# Patient Record
Sex: Female | Born: 1940 | Race: White | Hispanic: No
Health system: Midwestern US, Community
[De-identification: ages and names within clinical notes are randomized; demographics above are authoritative.]

## PROBLEM LIST (undated history)

## (undated) DIAGNOSIS — I10 Essential (primary) hypertension: Secondary | ICD-10-CM

## (undated) DIAGNOSIS — K279 Peptic ulcer, site unspecified, unspecified as acute or chronic, without hemorrhage or perforation: Secondary | ICD-10-CM

## (undated) DIAGNOSIS — K449 Diaphragmatic hernia without obstruction or gangrene: Secondary | ICD-10-CM

## (undated) DIAGNOSIS — K922 Gastrointestinal hemorrhage, unspecified: Secondary | ICD-10-CM

## (undated) DIAGNOSIS — M810 Age-related osteoporosis without current pathological fracture: Secondary | ICD-10-CM

## (undated) DIAGNOSIS — K2270 Barrett's esophagus without dysplasia: Secondary | ICD-10-CM

## (undated) DIAGNOSIS — R7303 Prediabetes: Secondary | ICD-10-CM

## (undated) DIAGNOSIS — M1990 Unspecified osteoarthritis, unspecified site: Secondary | ICD-10-CM

## (undated) HISTORY — PX: CHOLECYSTECTOMY: SHX55

## (undated) HISTORY — PX: OTHER SURGICAL HISTORY: SHX169

## (undated) HISTORY — PX: CATARACT EXTRACTION W/ INTRAOCULAR LENS IMPLANT: SHX1309

## (undated) HISTORY — PX: TONSILLECTOMY: SUR1361

---

## 2000-11-24 ENCOUNTER — Encounter: Payer: Self-pay | Admitting: Orthopedic Surgery

## 2000-11-24 ENCOUNTER — Encounter: Admission: RE | Admit: 2000-11-24 | Discharge: 2000-11-24 | Payer: Self-pay | Admitting: Orthopedic Surgery

## 2005-02-22 ENCOUNTER — Other Ambulatory Visit: Admission: RE | Admit: 2005-02-22 | Discharge: 2005-02-22 | Payer: Self-pay | Admitting: Family Medicine

## 2005-05-18 ENCOUNTER — Ambulatory Visit (HOSPITAL_COMMUNITY): Admission: RE | Admit: 2005-05-18 | Discharge: 2005-05-18 | Payer: Self-pay | Admitting: Gastroenterology

## 2008-04-25 ENCOUNTER — Ambulatory Visit (HOSPITAL_COMMUNITY): Admission: RE | Admit: 2008-04-25 | Discharge: 2008-04-25 | Payer: Self-pay | Admitting: Family Medicine

## 2008-07-25 ENCOUNTER — Encounter (INDEPENDENT_AMBULATORY_CARE_PROVIDER_SITE_OTHER): Payer: Self-pay | Admitting: Surgery

## 2008-07-25 ENCOUNTER — Ambulatory Visit (HOSPITAL_COMMUNITY): Admission: RE | Admit: 2008-07-25 | Discharge: 2008-07-25 | Payer: Self-pay | Admitting: Surgery

## 2010-05-04 ENCOUNTER — Ambulatory Visit (HOSPITAL_BASED_OUTPATIENT_CLINIC_OR_DEPARTMENT_OTHER): Admission: RE | Admit: 2010-05-04 | Discharge: 2010-05-04 | Payer: Self-pay | Admitting: Orthopaedic Surgery

## 2011-02-28 LAB — BASIC METABOLIC PANEL
BUN: 13 mg/dL (ref 6–23)
CO2: 30 mEq/L (ref 19–32)
Calcium: 8.8 mg/dL (ref 8.4–10.5)
Chloride: 103 mEq/L (ref 96–112)
Creatinine, Ser: 0.6 mg/dL (ref 0.4–1.2)
GFR calc Af Amer: >60 mL/min (ref >60)
GFR calc non Af Amer: >60 mL/min (ref >60)
Glucose, Bld: 114 mg/dL — ABNORMAL HIGH (ref 70–99)
Potassium: 3.9 mEq/L (ref 3.5–5.1)
Sodium: 140 mEq/L (ref 135–145)

## 2011-02-28 LAB — POCT HEMOGLOBIN-HEMACUE: Hemoglobin: 12 g/dL (ref 12.0–15.0)

## 2011-04-26 NOTE — Op Note (Signed)
Marie Carpenter, Marie Carpenter               ACCOUNT NO.:  [id number]   MEDICAL RECORD NO.:  [id number]          PATIENT TYPE:  AMB   LOCATION:  DAY                          FACILITY:  [facility]   PHYSICIAN:  Wilmon Arms. Corliss Skains, M.D. DATE OF BIRTH:  October 30, 1941   DATE OF PROCEDURE:  07/25/2008  DATE OF DISCHARGE:                               OPERATIVE REPORT   PREOPERATIVE DIAGNOSIS:  Chronic calculus cholecystitis.   POSTOPERATIVE DIAGNOSIS:  1. Chronic calculus cholecystitis.  2. Umbilical hernia.   PROCEDURE PERFORMED:  1. Laparoscopic cholecystectomy with intraoperative cholangiogram.  2. Umbilical hernia repair.   SURGEON:  Wilmon Arms. Corliss Skains, MD   ASSISTANT:  Velora Heckler, MD   ANESTHESIA:  General endotracheal.   INDICATIONS:  The patient is a 69 year old female who presented with a  couple months of intermittent epigastric pain and bloating.  She  initially started having some pain after eating some fast food.  A CT  scan showed several large gallstones but no sign of gallbladder wall  thickening.  Her liver function tests were normal.  She presents now for  elective cholecystectomy.   DESCRIPTION OF PROCEDURE:  The patient was brought to the operating room  and placed in a supine position on the operating table.  After an  adequate level of general anesthesia was obtained the patient's abdomen  was prepped with Betadine and draped in a sterile fashion.  A time-out  was taken to assure the proper patient and proper procedure.  The area  below her umbilicus was infiltrated with 0.25% Marcaine with  epinephrine.  We made a transverse incision here.  Dissection was  carried down to the fascia and the fascia was opened vertically between  Kocher clamps.  The peritoneal cavity was bluntly entered.  A stay  suture of 0 Vicryl was placed in the fascial opening.  The Hasson  cannula was inserted and secured to the stay suture.  Pneumoperitoneum  was obtained by insufflating CO2 and  maintaining a maximal pressure of  15 mmHg.  The laparoscope was inserted and the patient was positioned  reverse Trendelenburg and tilted to her left.  An 11 mm port was placed  in subxiphoid position.  Two 5 mm ports were placed in the right upper  quadrant.  A very large thick distended gallbladder was identified.  We  had difficulty grasping this with a clamp.  However, when I tried to  insert the aspiration needle into the gallbladder the wall was too thick  to allow passage of the needle.  We were finally able to get some  traction and retract the gallbladder superiorly.  There were a lot of  omental adhesions to the surface of the gallbladder.  These were taken  down with cautery and blunt dissection.  We were finally able to  identify the neck of the gallbladder.  We bluntly dissected around the  cystic duct.  The cystic duct was ligated with a clip distally.  A small  opening was created on the cystic duct.  A Cook cholangiogram catheter  was then inserted through a stab incision and  threaded into the cystic  duct.  It was secured with a clip.  A cholangiogram was obtained which  showed good flow proximally and distally in biliary tree with no sign of  filling defects or obstruction.  Contrast flowed easily in duodenum.  The catheter was then removed and the cystic duct was ligated with clips  and divided.  Two branch of the cystic artery were then ligated with  clips and divided.  Cautery was then used to remove the gallbladder from  the liver.  Hemostasis was good.  The gallbladder was placed in an  EndoCatch sac.  We thoroughly irrigated the right upper quadrant and  suctioned it dry.  No bleeding or bile leak was noted.  We then pulled  the EndoCatch sac and gallbladder up to the umbilical port site.  We had  to enlarge the fascia slightly to allow removal of this large  gallbladder with a couple of very large gallstones.  The fascia was then  reapproximated with a pursestring  suture.  We then noted an umbilical  hernia just above our fascial opening.  We enlarged our incision  slightly.  I dissected the umbilical hernia free from the undersurface  of the umbilicus.  This was dissected free circumferentially and was  reduced up through the hernia defect which measured only about a  centimeter across.  The hernia defect was then reapproximated with three  interrupted zero PDS sutures.  The subcutaneous tissues were irrigated.  Monocryl 4-0 was used to close all skin incisions after removing the  ports and releasing pneumoperitoneum.  Steri-Strips and Kling dressings  were applied.  The patient was then extubated and brought to recovery in  stable condition.  All sponge, instrument and needle counts were  correct.      Wilmon Arms. Tsuei, M.D.  Electronically Signed     MKT/MEDQ  D:  07/25/2008  T:  07/25/2008  Job:  [id number]

## 2011-04-29 NOTE — Op Note (Signed)
Marie Carpenter, Marie Carpenter               ACCOUNT NO.:  [id number]   MEDICAL RECORD NO.:  [id number]          PATIENT TYPE:  AMB   LOCATION:  ENDO                         FACILITY:  [facility]   PHYSICIAN:  John C. Madilyn Fireman, M.D.    DATE OF BIRTH:  November 29, 1941   DATE OF PROCEDURE:  05/18/2005  DATE OF DISCHARGE:                                 OPERATIVE REPORT   PROCEDURE:  Colonoscopy.   INDICATIONS FOR PROCEDURE:  Average risk colon cancer screening.   DESCRIPTION OF PROCEDURE:  The patient was placed in the left lateral  decubitus position and placed on the pulse monitor with continuous low-flow  oxygen delivered by nasal cannula. She was sedated with 50 mcg IV fentanyl  and 5 mg IV Versed. The Olympus video colonoscope was inserted into the  rectum and advanced to the cecum, confirmed by transillumination at  McBurney's and point visualization at the ileocecal valve and appendiceal  orifice. The prep was good. The cecum, ascending, transverse, and descending  colon all appeared normal with no masses, polyps, diverticula or other  mucosal abnormalities. Within the sigmoid colon, there were seen a few  scattered diverticula, no other abnormalities. The rectum appeared normal  and retroflexed view of the anus revealed no obvious internal hemorrhoids.  The scope was then withdrawn and the patient returned to the recovery room  in stable condition. She tolerated the procedure well and there were no  immediate complications.   IMPRESSION:  Diverticulosis otherwise normal study.   PLAN:  Next colonoscopy within 10 years and consider flexible sigmoidoscopy  and/or Hemoccult's in 5 years.      JCH/MEDQ  D:  05/18/2005  T:  05/18/2005  Job:  [id number]   cc:   Stacie Acres. White, M.D.  [address]  [place]  [place] [address]  Fax: [phone]

## 2011-09-07 LAB — BASIC METABOLIC PANEL
BUN: 6
CO2: 33 — ABNORMAL HIGH
Calcium: 9.3
Chloride: 100
Creatinine, Ser: 0.62
GFR calc Af Amer: >60
GFR calc non Af Amer: >60
Glucose, Bld: 140 — ABNORMAL HIGH
Potassium: 3.7
Sodium: 139

## 2011-09-09 LAB — DIFFERENTIAL
Eosinophils Absolute: 0.2
Eosinophils Relative: 3
Lymphs Abs: 2.5

## 2011-09-09 LAB — COMPREHENSIVE METABOLIC PANEL
ALT: 22
AST: 21
Alkaline Phosphatase: 36 — ABNORMAL LOW
CO2: 34 — ABNORMAL HIGH
Calcium: 9.3
Chloride: 102
GFR calc Af Amer: >60
GFR calc non Af Amer: >60
Potassium: 4.2
Sodium: 140

## 2011-09-09 LAB — CBC
MCHC: 33.1
RBC: 4.39
WBC: 7.7

## 2014-09-08 ENCOUNTER — Other Ambulatory Visit: Payer: Self-pay | Admitting: Family Medicine

## 2016-02-06 ENCOUNTER — Emergency Department (HOSPITAL_COMMUNITY): Payer: Medicare Other

## 2016-02-06 ENCOUNTER — Observation Stay (HOSPITAL_COMMUNITY)
Admission: EM | Admit: 2016-02-06 | Discharge: 2016-02-08 | Disposition: A | Discharge status: Home / Self Care | Payer: Medicare Other | Attending: Internal Medicine | Admitting: Internal Medicine

## 2016-02-06 ENCOUNTER — Encounter (HOSPITAL_COMMUNITY): Payer: Self-pay | Admitting: Emergency Medicine

## 2016-02-06 DIAGNOSIS — K922 Gastrointestinal hemorrhage, unspecified: Secondary | ICD-10-CM

## 2016-02-06 DIAGNOSIS — M25519 Pain in unspecified shoulder: Secondary | ICD-10-CM | POA: Insufficient documentation

## 2016-02-06 DIAGNOSIS — K2270 Barrett's esophagus without dysplasia: Secondary | ICD-10-CM | POA: Diagnosis not present

## 2016-02-06 DIAGNOSIS — Z87891 Personal history of nicotine dependence: Secondary | ICD-10-CM | POA: Diagnosis not present

## 2016-02-06 DIAGNOSIS — D62 Acute posthemorrhagic anemia: Secondary | ICD-10-CM | POA: Insufficient documentation

## 2016-02-06 DIAGNOSIS — Z79899 Other long term (current) drug therapy: Secondary | ICD-10-CM | POA: Insufficient documentation

## 2016-02-06 DIAGNOSIS — I9589 Other hypotension: Secondary | ICD-10-CM

## 2016-02-06 DIAGNOSIS — K921 Melena: Secondary | ICD-10-CM | POA: Diagnosis not present

## 2016-02-06 DIAGNOSIS — D72829 Elevated white blood cell count, unspecified: Secondary | ICD-10-CM | POA: Diagnosis not present

## 2016-02-06 DIAGNOSIS — K449 Diaphragmatic hernia without obstruction or gangrene: Secondary | ICD-10-CM

## 2016-02-06 DIAGNOSIS — Z7983 Long term (current) use of bisphosphonates: Secondary | ICD-10-CM | POA: Insufficient documentation

## 2016-02-06 DIAGNOSIS — I959 Hypotension, unspecified: Secondary | ICD-10-CM | POA: Diagnosis present

## 2016-02-06 DIAGNOSIS — K279 Peptic ulcer, site unspecified, unspecified as acute or chronic, without hemorrhage or perforation: Secondary | ICD-10-CM | POA: Diagnosis present

## 2016-02-06 DIAGNOSIS — K259 Gastric ulcer, unspecified as acute or chronic, without hemorrhage or perforation: Principal | ICD-10-CM | POA: Insufficient documentation

## 2016-02-06 DIAGNOSIS — M810 Age-related osteoporosis without current pathological fracture: Secondary | ICD-10-CM | POA: Diagnosis not present

## 2016-02-06 DIAGNOSIS — R0602 Shortness of breath: Secondary | ICD-10-CM | POA: Diagnosis present

## 2016-02-06 DIAGNOSIS — I10 Essential (primary) hypertension: Secondary | ICD-10-CM | POA: Insufficient documentation

## 2016-02-06 HISTORY — DX: Peptic ulcer, site unspecified, unspecified as acute or chronic, without hemorrhage or perforation: K27.9

## 2016-02-06 HISTORY — DX: Barrett's esophagus without dysplasia: K22.70

## 2016-02-06 HISTORY — DX: Age-related osteoporosis without current pathological fracture: M81.0

## 2016-02-06 HISTORY — DX: Essential (primary) hypertension: I10

## 2016-02-06 HISTORY — DX: Gastrointestinal hemorrhage, unspecified: K92.2

## 2016-02-06 HISTORY — DX: Diaphragmatic hernia without obstruction or gangrene: K44.9

## 2016-02-06 LAB — POC OCCULT BLOOD, ED: Fecal Occult Bld: POSITIVE — AB

## 2016-02-06 LAB — COMPREHENSIVE METABOLIC PANEL
ALT: 17 U/L (ref 14–54)
ANION GAP: 11 (ref 5–15)
AST: 20 U/L (ref 15–41)
Albumin: 3.5 g/dL (ref 3.5–5.0)
Alkaline Phosphatase: 23 U/L — ABNORMAL LOW (ref 38–126)
BILIRUBIN TOTAL: 0.4 mg/dL (ref 0.3–1.2)
BUN: 47 mg/dL — ABNORMAL HIGH (ref 6–20)
CHLORIDE: 102 mmol/L (ref 101–111)
CO2: 25 mmol/L (ref 22–32)
Calcium: 8.8 mg/dL — ABNORMAL LOW (ref 8.9–10.3)
Creatinine, Ser: 0.79 mg/dL (ref 0.44–1.00)
Glucose, Bld: 157 mg/dL — ABNORMAL HIGH (ref 65–99)
POTASSIUM: 4 mmol/L (ref 3.5–5.1)
Sodium: 138 mmol/L (ref 135–145)
TOTAL PROTEIN: 5.8 g/dL — AB (ref 6.5–8.1)

## 2016-02-06 LAB — CBC WITH DIFFERENTIAL/PLATELET
BASOS ABS: 0 10*3/uL (ref 0.0–0.1)
Basophils Relative: 0 %
EOS PCT: 0 %
Eosinophils Absolute: 0 10*3/uL (ref 0.0–0.7)
HEMATOCRIT: 24 % — AB (ref 36.0–46.0)
Hemoglobin: 8.1 g/dL — ABNORMAL LOW (ref 12.0–15.0)
LYMPHS PCT: 20 %
Lymphs Abs: 2.6 10*3/uL (ref 0.7–4.0)
MCH: 31.5 pg (ref 26.0–34.0)
MCHC: 33.8 g/dL (ref 30.0–36.0)
MCV: 93.4 fL (ref 78.0–100.0)
MONO ABS: 0.6 10*3/uL (ref 0.1–1.0)
MONOS PCT: 5 %
Neutro Abs: 10 10*3/uL — ABNORMAL HIGH (ref 1.7–7.7)
Neutrophils Relative %: 75 %
PLATELETS: 294 10*3/uL (ref 150–400)
RBC: 2.57 MIL/uL — ABNORMAL LOW (ref 3.87–5.11)
RDW: 13.7 % (ref 11.5–15.5)
WBC: 13.3 10*3/uL — ABNORMAL HIGH (ref 4.0–10.5)

## 2016-02-06 LAB — CBC
HEMATOCRIT: 22.8 % — AB (ref 36.0–46.0)
HEMOGLOBIN: 7.8 g/dL — AB (ref 12.0–15.0)
MCH: 31.8 pg (ref 26.0–34.0)
MCHC: 34.2 g/dL (ref 30.0–36.0)
MCV: 93.1 fL (ref 78.0–100.0)
Platelets: 208 10*3/uL (ref 150–400)
RBC: 2.45 MIL/uL — ABNORMAL LOW (ref 3.87–5.11)
RDW: 14.1 % (ref 11.5–15.5)
WBC: 11.5 10*3/uL — ABNORMAL HIGH (ref 4.0–10.5)

## 2016-02-06 LAB — RETICULOCYTES
RBC.: 2.37 MIL/uL — AB (ref 3.87–5.11)
RETIC COUNT ABSOLUTE: 54.5 10*3/uL (ref 19.0–186.0)
Retic Ct Pct: 2.3 % (ref 0.4–3.1)

## 2016-02-06 LAB — I-STAT TROPONIN, ED: TROPONIN I, POC: 0 ng/mL (ref 0.00–0.08)

## 2016-02-06 LAB — VITAMIN B12: Vitamin B-12: 103 pg/mL — ABNORMAL LOW (ref 180–914)

## 2016-02-06 LAB — IRON AND TIBC
IRON: 58 ug/dL (ref 28–170)
Saturation Ratios: 17 % (ref 10.4–31.8)
TIBC: 349 ug/dL (ref 250–450)
UIBC: 291 ug/dL

## 2016-02-06 LAB — FERRITIN: FERRITIN: 20 ng/mL (ref 11–307)

## 2016-02-06 LAB — FOLATE: Folate: 14.6 ng/mL (ref >5.9)

## 2016-02-06 LAB — PREPARE RBC (CROSSMATCH)

## 2016-02-06 LAB — ABO/RH: ABO/RH(D): O NEG

## 2016-02-06 LAB — D-DIMER, QUANTITATIVE (NOT AT ARMC)

## 2016-02-06 LAB — I-STAT CG4 LACTIC ACID, ED: LACTIC ACID, VENOUS: 2.94 mmol/L — AB (ref 0.5–2.0)

## 2016-02-06 MED ORDER — SODIUM CHLORIDE 0.9 % IV BOLUS (SEPSIS)
1000.0000 mL | Freq: Once | INTRAVENOUS | Status: AC
  Administered 2016-02-06: 1000 mL via INTRAVENOUS

## 2016-02-06 MED ORDER — SODIUM CHLORIDE 0.9 % IV SOLN
Freq: Once | INTRAVENOUS | Status: AC
  Administered 2016-02-06: 16:00:00 via INTRAVENOUS

## 2016-02-06 MED ORDER — ONDANSETRON HCL 4 MG PO TABS: 4.0000 mg | ORAL_TABLET | Freq: Four times a day (QID) | ORAL | Status: DC | PRN

## 2016-02-06 MED ORDER — ACETAMINOPHEN 325 MG PO TABS: 650.0000 mg | ORAL_TABLET | Freq: Four times a day (QID) | ORAL | Status: DC | PRN

## 2016-02-06 MED ORDER — ACETAMINOPHEN 650 MG RE SUPP: 650.0000 mg | Freq: Four times a day (QID) | RECTAL | Status: DC | PRN

## 2016-02-06 MED ORDER — SODIUM CHLORIDE 0.9 % IV SOLN
INTRAVENOUS | Status: DC
  Administered 2016-02-06 – 2016-02-07 (×2): via INTRAVENOUS

## 2016-02-06 MED ORDER — ONDANSETRON HCL 4 MG/2ML IJ SOLN: 4.0000 mg | Freq: Four times a day (QID) | INTRAMUSCULAR | Status: DC | PRN

## 2016-02-06 MED ORDER — PANTOPRAZOLE SODIUM 40 MG IV SOLR
40.0000 mg | Freq: Once | INTRAVENOUS | Status: AC
  Administered 2016-02-06: 40 mg via INTRAVENOUS
  Filled 2016-02-06: qty 40

## 2016-02-06 MED ORDER — PANTOPRAZOLE SODIUM 40 MG IV SOLR
40.0000 mg | Freq: Two times a day (BID) | INTRAVENOUS | Status: DC
  Administered 2016-02-06 – 2016-02-07 (×3): 40 mg via INTRAVENOUS
  Filled 2016-02-06 (×3): qty 40

## 2016-02-06 MED ORDER — SODIUM CHLORIDE 0.9% FLUSH: 3.0000 mL | Freq: Two times a day (BID) | INTRAVENOUS | Status: DC

## 2016-02-06 NOTE — ED Notes (Signed)
Pt reports dizziness and sob since Wednesday afternoon. SOB worse with exertion. Pt also having intermittent pain across shoulders. Pt went to an urgent care yesterday and was diagnosed with orthostatic hypotension. Pt has not taken BP medications today, but had BP of 72 systolic at home. Pt has bp of 86/72 sitting in triage.

## 2016-02-06 NOTE — ED Notes (Signed)
Pt reported SHOB and dizziness that worse today. Pt was seen at urgent care for same problem. Pt denies hitting head, LOC, headache and visual disturbances. Pt reported nonproductive cough.

## 2016-02-06 NOTE — ED Notes (Signed)
Bed: ZO10 Expected date:  Expected time:  Means of arrival:  Comments: Hold for Resus A

## 2016-02-06 NOTE — Consult Note (Signed)
[facility] Consult Note  Referring Provider: No ref. provider found Primary Care Physician:  Vidal Schwalbe, MD Primary Gastroenterologist:  Dr.  Laurel Dimmer Complaint: Weakness and dark stools HPI: Marie Carpenter is an 75 y.o. white female  presenting with 2 or 3 day history of dyspnea dizziness when standing and dark stools. She had a colonoscopy several months ago was unrevealing. She had a hemoglobin of 8.1 BUN of 47 and a creatinine of 179. She has no abdominal pain no nausea or vomiting. She has not eaten since last night.  Past Medical History  Diagnosis Date  . Hypertension     History reviewed. No pertinent past surgical history.   (Not in a hospital admission)  Allergies:  Allergies  Allergen Reactions  . Penicillins     Has patient had a PCN reaction causing immediate rash, facial/tongue/throat swelling, SOB or lightheadedness with hypotension: no Has patient had a PCN reaction causing severe rash involving mucus membranes or skin necrosis: no Has patient had a PCN reaction that required hospitalization no Has patient had a PCN reaction occurring within the last 10 years: yes 5 years ago If all of the above answers are "NO", then may proceed with Cephalosporin use.    Family History  Problem Relation Age of Onset  . Alzheimer's disease Mother   . Heart disease Father     Social History:  reports that she has quit smoking. She does not have any smokeless tobacco history on file. She reports that she does not drink alcohol or use illicit drugs.  Review of Systems: negative except as above   Blood pressure 136/53, pulse 93, temperature 97.8 F (36.6 C), temperature source Oral, resp. rate 17, weight 68.947 kg (152 lb), SpO2 99 %. Head: Normocephalic, without obvious abnormality, atraumatic Neck: no adenopathy, no carotid bruit, no JVD, supple, symmetrical, trachea midline and thyroid not enlarged, symmetric, no tenderness/mass/nodules Resp: clear to  auscultation bilaterally Cardio: regular rate and rhythm, S1, S2 normal, no murmur, click, rub or gallop GI: Abdomen soft nondistended with normoactive bowel sounds. no megaly masses or guarding Extremities: extremities normal, atraumatic, no cyanosis or edema  Results for orders placed or performed during the hospital encounter of 02/06/16 (from the past 48 hour(s))  CBC with Differential/Platelet     Status: Abnormal   Collection Time: 02/06/16  9:50 AM  Result Value Ref Range   WBC 13.3 (H) 4.0 - 10.5 K/uL   RBC 2.57 (L) 3.87 - 5.11 MIL/uL   Hemoglobin 8.1 (L) 12.0 - 15.0 g/dL   HCT 24.0 (L) 36.0 - 46.0 %   MCV 93.4 78.0 - 100.0 fL   MCH 31.5 26.0 - 34.0 pg   MCHC 33.8 30.0 - 36.0 g/dL   RDW 13.7 11.5 - 15.5 %   Platelets 294 150 - 400 K/uL   Neutrophils Relative % 75 %   Neutro Abs 10.0 (H) 1.7 - 7.7 K/uL   Lymphocytes Relative 20 %   Lymphs Abs 2.6 0.7 - 4.0 K/uL   Monocytes Relative 5 %   Monocytes Absolute 0.6 0.1 - 1.0 K/uL   Eosinophils Relative 0 %   Eosinophils Absolute 0.0 0.0 - 0.7 K/uL   Basophils Relative 0 %   Basophils Absolute 0.0 0.0 - 0.1 K/uL  Comprehensive metabolic panel     Status: Abnormal   Collection Time: 02/06/16  9:50 AM  Result Value Ref Range   Sodium 138 135 - 145 mmol/L   Potassium 4.0 3.5 - 5.1 mmol/L  Chloride 102 101 - 111 mmol/L   CO2 25 22 - 32 mmol/L   Glucose, Bld 157 (H) 65 - 99 mg/dL   BUN 47 (H) 6 - 20 mg/dL   Creatinine, Ser 0.79 0.44 - 1.00 mg/dL   Calcium 8.8 (L) 8.9 - 10.3 mg/dL   Total Protein 5.8 (L) 6.5 - 8.1 g/dL   Albumin 3.5 3.5 - 5.0 g/dL   AST 20 15 - 41 U/L   ALT 17 14 - 54 U/L   Alkaline Phosphatase 23 (L) 38 - 126 U/L   Total Bilirubin 0.4 0.3 - 1.2 mg/dL   GFR calc non Af Amer >60 >60 mL/min   GFR calc Af Amer >60 >60 mL/min    Comment: (NOTE) The eGFR has been calculated using the CKD EPI equation. This calculation has not been validated in all clinical situations. eGFR's persistently <60 mL/min signify  possible Chronic Kidney Disease.    Anion gap 11 5 - 15  D-dimer, quantitative (not at [facility])     Status: None   Collection Time: 02/06/16  9:50 AM  Result Value Ref Range   D-Dimer, Quant <0.27 0.00 - 0.50 ug/mL-FEU    Comment: (NOTE) At the manufacturer cut-off of 0.50 ug/mL FEU, this assay has been documented to exclude PE with a sensitivity and negative predictive value of 97 to 99%.  At this time, this assay has not been approved by the FDA to exclude DVT/VTE. Results should be correlated with clinical presentation.   I-stat troponin, ED (not at [facility])     Status: None   Collection Time: 02/06/16  9:58 AM  Result Value Ref Range   Troponin i, poc 0.00 0.00 - 0.08 ng/mL   Comment 3            Comment: Due to the release kinetics of cTnI, a negative result within the first hours of the onset of symptoms does not rule out myocardial infarction with certainty. If myocardial infarction is still suspected, repeat the test at appropriate intervals.   I-Stat CG4 Lactic Acid, ED     Status: Abnormal   Collection Time: 02/06/16 10:12 AM  Result Value Ref Range   Lactic Acid, Venous 2.94 (HH) 0.5 - 2.0 mmol/L   Comment NOTIFIED PHYSICIAN   Reticulocytes     Status: Abnormal   Collection Time: 02/06/16 11:36 AM  Result Value Ref Range   Retic Ct Pct 2.3 0.4 - 3.1 %   RBC. 2.37 (L) 3.87 - 5.11 MIL/uL   Retic Count, Manual 54.5 19.0 - 186.0 K/uL  POC occult blood, ED Provider will collect     Status: Abnormal   Collection Time: 02/06/16 11:37 AM  Result Value Ref Range   Fecal Occult Bld POSITIVE (A) NEGATIVE   Dg Chest Port 1 View  02/06/2016  CLINICAL DATA:  Shortness of breath since Wednesday, worsening. EXAM: PORTABLE CHEST 1 VIEW COMPARISON:  07/24/2008 FINDINGS: The heart size and mediastinal contours are within normal limits. Both lungs are clear. The visualized skeletal structures are unremarkable. IMPRESSION: No active disease. Electronically Signed   By: Rolm Baptise  M.D.   On: 02/06/2016 10:08    Assessment: Melena and hypotension indicative of upper GI bleeding. Plan Plan:  Plan Will start PPI and plan endoscopy within the next day. Cristiano Capri C 02/06/2016, 12:38 PM  Pager [phone] If no answer or after 5 PM call [phone]

## 2016-02-06 NOTE — ED Notes (Signed)
Hospitalist at bedside 

## 2016-02-06 NOTE — H&P (Addendum)
[facility] History and Physical  Marie Carpenter PFX:[id number] DOB: 03-28-1941 DOA: 02/06/2016   PCP: Vidal Schwalbe, MD  Specialists: Dr. Amedeo Plenty is her gastroenterologist  Chief Complaint: Dizziness and shortness of breath over the past 3 days  HPI: Marie Carpenter is a 75 y.o. female with a past medical history of hypertension, osteoporosis, diverticulosis, who was in her usual state of health till about 3 days ago when she noticed that she was getting dizzy and short of breath especially with exertion. She denies any syncopal episode. She had some discomfort in her shoulders, but this has been ongoing for a while. She denies any abdominal pain. Denies vomiting but has been feeling somewhat queasy. She has noticed increased burping and acidity over the last few days and has taken more Tums than usual. She has noticed a decreased appetite over the last few days. For the last 3 days she's noted dark colored stools, however, denies any blood in the stool. Denies any leg swelling. Denies any excessive use of pain medications including Motrin or aspirin.  In the emergency department, patient was found to be anemic and hypotensive. She was found to have black stool on rectal examination, which was heme positive. She will be admitted for further management of GI bleed.  Home Medications: Prior to Admission medications   Medication Sig Start Date End Date Taking? Authorizing Provider  alendronate (FOSAMAX) 70 MG tablet Take 70 mg by mouth once a week. Take with a full glass of water on an empty stomach.   Yes Historical Provider, MD  amLODipine (NORVASC) 2.5 MG tablet Take 2.5 mg by mouth daily. 12/27/15  Yes Historical Provider, MD  cholecalciferol (VITAMIN D) 1000 units tablet Take 1,000 Units by mouth daily.   Yes Historical Provider, MD  lisinopril (PRINIVIL,ZESTRIL) 20 MG tablet Take 20 mg by mouth daily. 12/27/15   Historical Provider, MD    Allergies:  Allergies  Allergen Reactions    . Penicillins     Has patient had a PCN reaction causing immediate rash, facial/tongue/throat swelling, SOB or lightheadedness with hypotension: no Has patient had a PCN reaction causing severe rash involving mucus membranes or skin necrosis: no Has patient had a PCN reaction that required hospitalization no Has patient had a PCN reaction occurring within the last 10 years: yes 5 years ago If all of the above answers are "NO", then may proceed with Cephalosporin use.    Past Medical History: Past Medical History  Diagnosis Date  . Hypertension     History reviewed. No pertinent past surgical history.  Social History: She lives alone in [place]. Quit smoking 40 years ago. Used to drink 2-3 glasses of wine every day till many months ago when she quit. Denies any illicit drug use. Was usually independent with daily activities.  Family History:  Family History  Problem Relation Age of Onset  . Alzheimer's disease Mother   . Heart disease Father      Review of Systems - History obtained from the patient General ROS: positive for  - fatigue Psychological ROS: negative Ophthalmic ROS: negative ENT ROS: negative Allergy and Immunology ROS: negative Hematological and Lymphatic ROS: negative Endocrine ROS: negative Respiratory ROS: as in hpi Cardiovascular ROS: as in hpi Gastrointestinal ROS: as in hpi Genito-Urinary ROS: no dysuria, trouble voiding, or hematuria Musculoskeletal ROS: as in hpi Neurological ROS: no TIA or stroke symptoms Dermatological ROS: negative  Physical Examination  Filed Vitals:   02/06/16 1030 02/06/16 1051 02/06/16 1100 02/06/16 1130  BP:  106/35 122/48 136/53  Pulse: 88 89 91 93  Temp:      TempSrc:      Resp: _0 Weight:      SpO2: 98% 91% 99% 99%    BP 136/53 mmHg  Pulse 93  Temp(Src) 97.8 F (36.6 C) (Oral)  Resp 17  Wt 68.947 kg (152 lb)  SpO2 99%  General appearance: alert, cooperative, appears stated age and no  distress Head: Normocephalic, without obvious abnormality, atraumatic Eyes: conjunctivae/corneas clear. PERRL, EOM's intact.  Throat: lips, mucosa, and tongue normal; teeth and gums normal Neck: no adenopathy, no carotid bruit, no JVD, supple, symmetrical, trachea midline and thyroid not enlarged, symmetric, no tenderness/mass/nodules Resp: clear to auscultation bilaterally Cardio: regular rate and rhythm, S1, S2 normal, no murmur, click, rub or gallop GI: soft, non-tender; bowel sounds normal; no masses,  no organomegaly Extremities: Good range of motion of both upper extremities Pulses: 2+ and symmetric Skin: Skin color, texture, turgor normal. No rashes or lesions Lymph nodes: Cervical, supraclavicular, and axillary nodes normal. Neurologic: Alert and awake. Oriented 3. Cranial nerves II-12 intact. Motor strength equal bilateral upper and lower extremities.  Laboratory Data: Results for orders placed or performed during the hospital encounter of 02/06/16 (from the past 48 hour(s))  CBC with Differential/Platelet     Status: Abnormal   Collection Time: 02/06/16  9:50 AM  Result Value Ref Range   WBC 13.3 (H) 4.0 - 10.5 K/uL   RBC 2.57 (L) 3.87 - 5.11 MIL/uL   Hemoglobin 8.1 (L) 12.0 - 15.0 g/dL   HCT 24.0 (L) 36.0 - 46.0 %   MCV 93.4 78.0 - 100.0 fL   MCH 31.5 26.0 - 34.0 pg   MCHC 33.8 30.0 - 36.0 g/dL   RDW 13.7 11.5 - 15.5 %   Platelets 294 150 - 400 K/uL   Neutrophils Relative % 75 %   Neutro Abs 10.0 (H) 1.7 - 7.7 K/uL   Lymphocytes Relative 20 %   Lymphs Abs 2.6 0.7 - 4.0 K/uL   Monocytes Relative 5 %   Monocytes Absolute 0.6 0.1 - 1.0 K/uL   Eosinophils Relative 0 %   Eosinophils Absolute 0.0 0.0 - 0.7 K/uL   Basophils Relative 0 %   Basophils Absolute 0.0 0.0 - 0.1 K/uL  Comprehensive metabolic panel     Status: Abnormal   Collection Time: 02/06/16  9:50 AM  Result Value Ref Range   Sodium 138 135 - 145 mmol/L   Potassium 4.0 3.5 - 5.1 mmol/L   Chloride 102 101 -  111 mmol/L   CO2 25 22 - 32 mmol/L   Glucose, Bld 157 (H) 65 - 99 mg/dL   BUN 47 (H) 6 - 20 mg/dL   Creatinine, Ser 0.79 0.44 - 1.00 mg/dL   Calcium 8.8 (L) 8.9 - 10.3 mg/dL   Total Protein 5.8 (L) 6.5 - 8.1 g/dL   Albumin 3.5 3.5 - 5.0 g/dL   AST 20 15 - 41 U/L   ALT 17 14 - 54 U/L   Alkaline Phosphatase 23 (L) 38 - 126 U/L   Total Bilirubin 0.4 0.3 - 1.2 mg/dL   GFR calc non Af Amer >60 >60 mL/min   GFR calc Af Amer >60 >60 mL/min    Comment: (NOTE) The eGFR has been calculated using the CKD EPI equation. This calculation has not been validated in all clinical situations. eGFR's persistently <60 mL/min signify possible Chronic Kidney Disease.  Anion gap 11 5 - 15  D-dimer, quantitative (not at [facility])     Status: None   Collection Time: 02/06/16  9:50 AM  Result Value Ref Range   D-Dimer, Quant <0.27 0.00 - 0.50 ug/mL-FEU    Comment: (NOTE) At the manufacturer cut-off of 0.50 ug/mL FEU, this assay has been documented to exclude PE with a sensitivity and negative predictive value of 97 to 99%.  At this time, this assay has not been approved by the FDA to exclude DVT/VTE. Results should be correlated with clinical presentation.   I-stat troponin, ED (not at [facility])     Status: None   Collection Time: 02/06/16  9:58 AM  Result Value Ref Range   Troponin i, poc 0.00 0.00 - 0.08 ng/mL   Comment 3            Comment: Due to the release kinetics of cTnI, a negative result within the first hours of the onset of symptoms does not rule out myocardial infarction with certainty. If myocardial infarction is still suspected, repeat the test at appropriate intervals.   I-Stat CG4 Lactic Acid, ED     Status: Abnormal   Collection Time: 02/06/16 10:12 AM  Result Value Ref Range   Lactic Acid, Venous 2.94 (HH) 0.5 - 2.0 mmol/L   Comment NOTIFIED PHYSICIAN   Reticulocytes     Status: Abnormal   Collection Time: 02/06/16 11:36 AM  Result Value Ref Range   Retic Ct Pct 2.3 0.4 -  3.1 %   RBC. 2.37 (L) 3.87 - 5.11 MIL/uL   Retic Count, Manual 54.5 19.0 - 186.0 K/uL  POC occult blood, ED Provider will collect     Status: Abnormal   Collection Time: 02/06/16 11:37 AM  Result Value Ref Range   Fecal Occult Bld POSITIVE (A) NEGATIVE    Radiology Reports: Dg Chest Port 1 View  02/06/2016  CLINICAL DATA:  Shortness of breath since Wednesday, worsening. EXAM: PORTABLE CHEST 1 VIEW COMPARISON:  07/24/2008 FINDINGS: The heart size and mediastinal contours are within normal limits. Both lungs are clear. The visualized skeletal structures are unremarkable. IMPRESSION: No active disease. Electronically Signed   By: Rolm Baptise M.D.   On: 02/06/2016 10:08    My interpretation of Electrocardiogram: Sinus rhythm at 98 bpm. Normal axis. Intervals are normal. No Q waves. No concerning ST or T-wave changes.  Problem List  Principal Problem:   UGI bleed Active Problems:   Melena   Osteoporosis   Essential hypertension   Acute blood loss anemia   Assessment: This is a 75 year old Caucasian female with past medical history of hypertension, osteoporosis who comes in with a 3-4 day history of dizziness, dyspnea on exertion, and dark stools. She was noted to be hypotensive. She has anemia. She was found to have heme positive stool. It appears that she may have had upper GI bleed. Limited lactic acid level is most likely due to hypotension. We will follow-up on repeat level.  Plan: #1 Upper GI bleed: Patient will be admitted to telemetry since her blood pressure has improved with fluid boluses. She is hemodynamically stable. She'll be kept nothing by mouth. She'll be given PPI. Discussed with Dr. Amedeo Plenty who will consult on her. She is noted to be on bisphosphonates for her osteoporosis. But she tells me that she takes it as recommended, with a full glass of water and sits upright after taking the medication. Differential diagnosis for presentation is broad including peptic ulcer disease,  AVM, esophagitis, gastritis.  #2 acute blood loss anemia: Patient appears to be symptomatic from her low hemoglobin. She will be transfused 1 unit of blood to begin with. Hemoglobin will be monitored closely. Anemia panel is pending. No recent hemoglobin in our system. Patient tells me that she was last seen by her PCP in September and blood work was done and she was not told of any concerning findings.  #3 Hypotension: Likely secondary to the GI bleed. Cannot identify any other etiology. Blood pressure has improved with fluid boluses. Her antihypertensive medications will be held for now. She'll be monitored.  #4 history of essential hypertension: As above. Holding her medications.  #5 leukocytosis: Likely due to acute stress. She is afebrile. No infectious source identified. Continue to monitor.  #6 history of osteoporosis: Hold her bisphosphonate for now.  #7 Shoulder pain: This is chronic. Patient denies any falls or injuries. She blames this on excessive use of her ipad. No abnormality noted on examination. D-dimer is normal. EKG is nonischemic. Troponin is normal. Outpatient follow-up.   DVT Prophylaxis: SCDs Code Status: Full code Family Communication: Discussed with the patient and her son  Disposition Plan: Admit to telemetry   Further management decisions will depend on results of further testing and patient's response to treatment.   [facility]  [facility] Pager [phone]  If 7PM-7AM, please contact night-coverage [email or web address] Password [facility]  02/06/2016, 12:19 PM

## 2016-02-06 NOTE — ED Provider Notes (Signed)
CSN: [id number]     Arrival date & time 02/06/16  0932 History   First MD Initiated Contact with Patient 02/06/16 [phone]     Chief Complaint  Patient presents with  . Shortness of Breath  . Dizziness     (Consider location/radiation/quality/duration/timing/severity/associated sxs/prior Treatment) Patient is a 75 y.o. female presenting with shortness of breath and dizziness. The history is provided by the patient (The patient complains of shortness of breath dizziness weakness for 3 days and also black stools for 3 days).  Shortness of Breath Severity:  Mild Onset quality:  Sudden Timing:  Intermittent Progression:  Waxing and waning Chronicity:  New Context: activity   Associated symptoms: no abdominal pain, no chest pain, no cough, no headaches and no rash   Dizziness Associated symptoms: shortness of breath   Associated symptoms: no chest pain, no diarrhea and no headaches     Past Medical History  Diagnosis Date  . Hypertension    History reviewed. No pertinent past surgical history. History reviewed. No pertinent family history. Social History  Substance Use Topics  . Smoking status: Former Games developer  . Smokeless tobacco: None  . Alcohol Use: No   OB History    No data available     Review of Systems  Constitutional: Negative for appetite change and fatigue.  HENT: Negative for congestion, ear discharge and sinus pressure.   Eyes: Negative for discharge.  Respiratory: Positive for shortness of breath. Negative for cough.   Cardiovascular: Negative for chest pain.  Gastrointestinal: Negative for abdominal pain and diarrhea.  Genitourinary: Negative for frequency and hematuria.  Musculoskeletal: Negative for back pain.  Skin: Negative for rash.  Neurological: Positive for dizziness. Negative for seizures and headaches.  Psychiatric/Behavioral: Negative for hallucinations.      Allergies  Penicillins  Home Medications   Prior to Admission medications    Medication Sig Start Date End Date Taking? Authorizing Provider  alendronate (FOSAMAX) 70 MG tablet Take 70 mg by mouth once a week. Take with a full glass of water on an empty stomach.   Yes Historical Provider, MD  amLODipine (NORVASC) 2.5 MG tablet Take 2.5 mg by mouth daily. 12/27/15  Yes Historical Provider, MD  cholecalciferol (VITAMIN D) 1000 units tablet Take 1,000 Units by mouth daily.   Yes Historical Provider, MD  lisinopril (PRINIVIL,ZESTRIL) 20 MG tablet Take 20 mg by mouth daily. 12/27/15   Historical Provider, MD   BP 136/53 mmHg  Pulse 93  Temp(Src) 97.8 F (36.6 C) (Oral)  Resp 17  Wt 152 lb (68.947 kg)  SpO2 99% Physical Exam  Constitutional: She is oriented to person, place, and time. She appears well-developed.  HENT:  Head: Normocephalic.  Eyes: Conjunctivae and EOM are normal. No scleral icterus.  Neck: Neck supple. No thyromegaly present.  Cardiovascular: Normal rate and regular rhythm.  Exam reveals no gallop and no friction rub.   No murmur heard. Pulmonary/Chest: No stridor. She has no wheezes. She has no rales. She exhibits no tenderness.  Abdominal: She exhibits no distension. There is no tenderness. There is no rebound.  Genitourinary:  Rectal exam black stool heme-positive  Musculoskeletal: Normal range of motion. She exhibits no edema.  Lymphadenopathy:    She has no cervical adenopathy.  Neurological: She is oriented to person, place, and time. She exhibits normal muscle tone. Coordination normal.  Skin: No rash noted. No erythema.  Psychiatric: She has a normal mood and affect. Her behavior is normal.    ED Course  Procedures (including critical care time) Labs Review Labs Reviewed  CBC WITH DIFFERENTIAL/PLATELET - Abnormal; Notable for the following:    WBC 13.3 (*)    RBC 2.57 (*)    Hemoglobin 8.1 (*)    HCT 24.0 (*)    Neutro Abs 10.0 (*)    All other components within normal limits  COMPREHENSIVE METABOLIC PANEL - Abnormal; Notable for  the following:    Glucose, Bld 157 (*)    BUN 47 (*)    Calcium 8.8 (*)    Total Protein 5.8 (*)    Alkaline Phosphatase 23 (*)    All other components within normal limits  I-STAT CG4 LACTIC ACID, ED - Abnormal; Notable for the following:    Lactic Acid, Venous 2.94 (*)    All other components within normal limits  POC OCCULT BLOOD, ED - Abnormal; Notable for the following:    Fecal Occult Bld POSITIVE (*)    All other components within normal limits  D-DIMER, QUANTITATIVE (NOT AT [facility])  URINALYSIS, ROUTINE W REFLEX MICROSCOPIC (NOT AT [facility])  VITAMIN B12  FOLATE  IRON AND TIBC  FERRITIN  RETICULOCYTES  I-STAT TROPOININ, ED  TYPE AND SCREEN    Imaging Review Dg Chest Port 1 View  02/06/2016  CLINICAL DATA:  Shortness of breath since Wednesday, worsening. EXAM: PORTABLE CHEST 1 VIEW COMPARISON:  07/24/2008 FINDINGS: The heart size and mediastinal contours are within normal limits. Both lungs are clear. The visualized skeletal structures are unremarkable. IMPRESSION: No active disease. Electronically Signed   By: Charlett Nose M.D.   On: 02/06/2016 10:08   I have personally reviewed and evaluated these images and lab results as part of my medical decision-making.   EKG Interpretation   Date/Time:  Saturday February 06 2016 09:41:36 EST Ventricular Rate:  98 PR Interval:  150 QRS Duration: 84 QT Interval:  335 QTC Calculation: 428 R Axis:   39 Text Interpretation:  Sinus rhythm Low voltage, precordial leads  Nonspecific T abnormalities, lateral leads Confirmed by Yu Peggs  MD, Jomarie Longs  ([id number]) on 02/06/2016 11:25:33 AM Also confirmed by Harika Laidlaw  MD, Jomarie Longs  ([id number])  on 02/06/2016 11:41:13 AM      MDM   Final diagnoses:  UGI bleed    Patient with upper GI bleed and anemia. She will be admitted for further workup    Bethann Berkshire, MD 02/06/16 1200

## 2016-02-07 ENCOUNTER — Encounter (HOSPITAL_COMMUNITY): Payer: Self-pay

## 2016-02-07 ENCOUNTER — Encounter (HOSPITAL_COMMUNITY)
Admission: EM | Disposition: A | Discharge status: Home / Self Care | Payer: Self-pay | Source: Home / Self Care | Attending: Emergency Medicine

## 2016-02-07 DIAGNOSIS — M810 Age-related osteoporosis without current pathological fracture: Secondary | ICD-10-CM

## 2016-02-07 DIAGNOSIS — K2270 Barrett's esophagus without dysplasia: Secondary | ICD-10-CM

## 2016-02-07 DIAGNOSIS — K921 Melena: Secondary | ICD-10-CM

## 2016-02-07 DIAGNOSIS — K22719 Barrett's esophagus with dysplasia, unspecified: Secondary | ICD-10-CM | POA: Diagnosis not present

## 2016-02-07 DIAGNOSIS — K922 Gastrointestinal hemorrhage, unspecified: Secondary | ICD-10-CM | POA: Diagnosis not present

## 2016-02-07 DIAGNOSIS — K449 Diaphragmatic hernia without obstruction or gangrene: Secondary | ICD-10-CM

## 2016-02-07 DIAGNOSIS — I959 Hypotension, unspecified: Secondary | ICD-10-CM

## 2016-02-07 DIAGNOSIS — I10 Essential (primary) hypertension: Secondary | ICD-10-CM | POA: Diagnosis not present

## 2016-02-07 DIAGNOSIS — D62 Acute posthemorrhagic anemia: Secondary | ICD-10-CM | POA: Diagnosis not present

## 2016-02-07 DIAGNOSIS — K279 Peptic ulcer, site unspecified, unspecified as acute or chronic, without hemorrhage or perforation: Secondary | ICD-10-CM

## 2016-02-07 HISTORY — DX: Peptic ulcer, site unspecified, unspecified as acute or chronic, without hemorrhage or perforation: K27.9

## 2016-02-07 HISTORY — DX: Diaphragmatic hernia without obstruction or gangrene: K44.9

## 2016-02-07 HISTORY — PX: ESOPHAGOGASTRODUODENOSCOPY: SHX5428

## 2016-02-07 HISTORY — DX: Barrett's esophagus without dysplasia: K22.70

## 2016-02-07 LAB — COMPREHENSIVE METABOLIC PANEL
ALBUMIN: 2.8 g/dL — AB (ref 3.5–5.0)
ALT: 13 U/L — ABNORMAL LOW (ref 14–54)
ANION GAP: 5 (ref 5–15)
AST: 18 U/L (ref 15–41)
Alkaline Phosphatase: 16 U/L — ABNORMAL LOW (ref 38–126)
BILIRUBIN TOTAL: 0.4 mg/dL (ref 0.3–1.2)
BUN: 24 mg/dL — ABNORMAL HIGH (ref 6–20)
CO2: 26 mmol/L (ref 22–32)
Calcium: 7.8 mg/dL — ABNORMAL LOW (ref 8.9–10.3)
Chloride: 111 mmol/L (ref 101–111)
Creatinine, Ser: 0.54 mg/dL (ref 0.44–1.00)
GFR calc Af Amer: >60 mL/min (ref >60)
GFR calc non Af Amer: >60 mL/min (ref >60)
GLUCOSE: 121 mg/dL — AB (ref 65–99)
POTASSIUM: 3.9 mmol/L (ref 3.5–5.1)
SODIUM: 142 mmol/L (ref 135–145)
TOTAL PROTEIN: 4.6 g/dL — AB (ref 6.5–8.1)

## 2016-02-07 LAB — CBC
HCT: 20.9 % — ABNORMAL LOW (ref 36.0–46.0)
Hemoglobin: 7 g/dL — ABNORMAL LOW (ref 12.0–15.0)
MCH: 31.5 pg (ref 26.0–34.0)
MCHC: 33.5 g/dL (ref 30.0–36.0)
MCV: 94.1 fL (ref 78.0–100.0)
PLATELETS: 187 10*3/uL (ref 150–400)
RBC: 2.22 MIL/uL — ABNORMAL LOW (ref 3.87–5.11)
RDW: 14.8 % (ref 11.5–15.5)
WBC: 8.3 10*3/uL (ref 4.0–10.5)

## 2016-02-07 LAB — PREPARE RBC (CROSSMATCH)

## 2016-02-07 SURGERY — EGD (ESOPHAGOGASTRODUODENOSCOPY)
Anesthesia: Moderate Sedation | Laterality: Left

## 2016-02-07 MED ORDER — FENTANYL CITRATE (PF) 100 MCG/2ML IJ SOLN
INTRAMUSCULAR | Status: AC
  Filled 2016-02-07: qty 2

## 2016-02-07 MED ORDER — MIDAZOLAM HCL 10 MG/2ML IJ SOLN
INTRAMUSCULAR | Status: DC | PRN
  Administered 2016-02-07 (×2): 2 mg via INTRAVENOUS

## 2016-02-07 MED ORDER — DIPHENHYDRAMINE HCL 50 MG/ML IJ SOLN
INTRAMUSCULAR | Status: AC
  Filled 2016-02-07: qty 1

## 2016-02-07 MED ORDER — SODIUM CHLORIDE 0.9 % IV SOLN: INTRAVENOUS | Status: DC

## 2016-02-07 MED ORDER — BUTAMBEN-TETRACAINE-BENZOCAINE 2-2-14 % EX AERO
INHALATION_SPRAY | CUTANEOUS | Status: DC | PRN
  Administered 2016-02-07: 2 via TOPICAL

## 2016-02-07 MED ORDER — SODIUM CHLORIDE 0.9 % IV SOLN
Freq: Once | INTRAVENOUS | Status: AC
Start: 2016-02-07 — End: 2016-02-07
  Administered 2016-02-07: 13:00:00 via INTRAVENOUS

## 2016-02-07 MED ORDER — FENTANYL CITRATE (PF) 100 MCG/2ML IJ SOLN
INTRAMUSCULAR | Status: DC | PRN
  Administered 2016-02-07 (×2): 25 ug via INTRAVENOUS

## 2016-02-07 MED ORDER — MIDAZOLAM HCL 5 MG/ML IJ SOLN
INTRAMUSCULAR | Status: AC
  Filled 2016-02-07: qty 1

## 2016-02-07 NOTE — Progress Notes (Signed)
Progress Note   Marie Carpenter ZOX:[id number] DOB: Jan 26, 1941 DOA: February 28, 2016 PCP: Cala Bradford, MD   Brief Narrative:   Marie Carpenter is an 75 y.o. female with a past medical history of hypertension, osteoporosis, diverticulosis, who was in her usual state of health till about 3 days ago when she noticed that she was getting dizzy and short of breath especially with exertion, and presented to the ED for evaluation on 02/28/16. In the emergency department, patient was found to be anemic and hypotensive. She was found to have black stool on rectal examination, which was heme positive. GI was subsequently consulted.  Assessment/Plan:   Principal Problem:   UGI bleed/melena/hypotension/acute blood loss anemia/PUD - Continue PPI. GI consulting. Elevated BUN suggests upper GI source. - Hemoglobin 7 this morning. We'll give 2 units of PRBCs. - Continue to hydrate. - EGD done 02/07/16: 2 peptic ulcers. F/U H. Pylori. - Will need repeat EGD in 2 months to assess healing and obtain biopsies of Barrett's esophagus.    Hiatal hernia/Barrett's Esophagus - Double dose PPI. - As above.  Active Problems:   Osteoporosis - Bisphosphonate on hold.    Essential hypertension - Hold antihypertensives.    Leukocytosis - Resolved.    DVT Prophylaxis - SCDs ordered.   Family Communication/Anticipated D/C date and plan/Code Status   Family Communication: Daughter at the bedside. Disposition Plan/date: Home when hemoglobin stable for at least 24 hours, depending on EGD results. Code Status: Full code.   IV Access:    Peripheral IV   Procedures and diagnostic studies:   Dg Chest Port 1 View  28-Feb-2016  CLINICAL DATA:  Shortness of breath since Wednesday, worsening. EXAM: PORTABLE CHEST 1 VIEW COMPARISON:  07/24/2008 FINDINGS: The heart size and mediastinal contours are within normal limits. Both lungs are clear. The visualized skeletal structures are unremarkable. IMPRESSION: No  active disease. Electronically Signed   By: Charlett Nose M.D.   On: February 28, 2016 10:08   EGD 02/07/16  ENDOSCOPIC IMPRESSION 2 gastric ulcers without stigmata of hemorrhage currently Barrett's esophagus hiatal hernia:   RECOMMENDATIONS: double dose PPI Check H. pylori antibody Repeat EGD in 2 months to assess healing and to obtain Barrett's Biopsies.  Medical Consultants:    Gastroenterology  Anti-Infectives:   Anti-infectives    None      Subjective:    Marie Carpenter feels well.  Had a black stool this morning.  Denies current dizziness or dyspnea.  No abdominal pain, nausea or vomiting.  Objective:    Filed Vitals:   February 28, 2016 1632 02-28-16 1900 Feb 28, 2016 2213 02/07/16 0544  BP: 122/44 118/51 133/46 124/52  Pulse: 91 81 89 83  Temp: 98.4 F (36.9 C) 98.6 F (37 C) 98 F (36.7 C) 97.9 F (36.6 C)  TempSrc:  Oral Oral Oral  Resp: Height:      Weight:      SpO2: 96% 98% 98% 100%    Intake/Output Summary (Last 24 hours) at 02/07/16 0856 Last data filed at February 28, 2016 2200  Gross per 24 hour  Intake 958.75 ml  Output      0 ml  Net 958.75 ml   Filed Weights   02/28/2016 0939 02/28/2016 1323  Weight: 68.947 kg (152 lb) 71.85 kg (158 lb 6.4 oz)    Exam: Gen:  NAD Cardiovascular:  RRR, No M/R/G Respiratory:  Lungs CTAB Gastrointestinal:  Abdomen soft, NT/ND, + BS Extremities:  No C/E/C   Data Reviewed:  Labs: Basic Metabolic Panel:  Recent Labs Lab 02/06/16 0950 02/07/16 0502  NA 138 142  K 4.0 3.9  CL 102 111  CO2 25 26  GLUCOSE 157* 121*  BUN 47* 24*  CREATININE 0.79 0.54  CALCIUM 8.8* 7.8*   GFR Estimated Creatinine Clearance: 60 mL/min (by C-G formula based on Cr of 0.54). Liver Function Tests:  Recent Labs Lab 02/06/16 0950 02/07/16 0502  AST 20 18  ALT 17 13*  ALKPHOS 23* 16*  BILITOT 0.4 0.4  PROT 5.8* 4.6*  ALBUMIN 3.5 2.8*   CBC:  Recent Labs Lab 02/06/16 0950 02/06/16 2059 02/07/16 0502  WBC  13.3* 11.5* 8.3  NEUTROABS 10.0*  --   --   HGB 8.1* 7.8* 7.0*  HCT 24.0* 22.8* 20.9*  MCV 93.4 93.1 94.1  PLT 294 208 187   D-Dimer:  Recent Labs  02/06/16 0950  DDIMER <0.27   Anemia work up:  Recent Labs  02/06/16 1136  VITAMINB12 103*  FOLATE 14.6  FERRITIN 20  TIBC 349  IRON 58  RETICCTPCT 2.3   Sepsis Labs:  Recent Labs Lab 02/06/16 0950 02/06/16 1012 02/06/16 2059 02/07/16 0502  WBC 13.3*  --  11.5* 8.3  LATICACIDVEN  --  2.94*  --   --    Microbiology No results found for this or any previous visit (from the past 240 hour(s)).   Medications:   . pantoprazole (PROTONIX) IV  40 mg Intravenous Q12H  . sodium chloride flush  3 mL Intravenous Q12H   Continuous Infusions: . sodium chloride 75 mL/hr at 02/07/16 0541    Time spent: 35 minutes with > 50% of time discussing current diagnostic test results, clinical impression and plan of care.   LOS: 1 day   RAMA,CHRISTINA  [facility] Pager [phone]. If unable to reach me by pager, please call my cell phone at [phone].  *Please refer to [email or web address], password [email or web address] to get updated schedule on who will round on this patient, as hospitalists switch teams weekly. If 7PM-7AM, please contact night-coverage at [email or web address], password [email or web address] for any overnight needs.  02/07/2016, 8:56 AM

## 2016-02-07 NOTE — Progress Notes (Signed)
[facility] Progress Note  Subjective: Patient had one maroon stool overnight fairly significant volume, transfused 1 unit of packed red blood cells but still hemoglobin has fallen to 7.0.  Objective: Vital signs in last 24 hours: Temp:  [97.8 F (36.6 C)-98.6 F (37 C)] 97.9 F (36.6 C) (02/26 0544) Pulse Rate:  [81-98] 83 (02/26 0544) Resp:  [14-25] 18 (02/26 0544) BP: (86-136)/(35-72) 124/52 mmHg (02/26 0544) SpO2:  [91 %-100 %] 100 % (02/26 0544) Weight:  [68.947 kg (152 lb)-71.85 kg (158 lb 6.4 oz)] 71.85 kg (158 lb 6.4 oz) (02/25 1323) Weight change:    PE: Unchanged  Lab Results: Results for orders placed or performed during the hospital encounter of 02/06/16 (from the past 24 hour(s))  CBC with Differential/Platelet     Status: Abnormal   Collection Time: 02/06/16  9:50 AM  Result Value Ref Range   WBC 13.3 (H) 4.0 - 10.5 K/uL   RBC 2.57 (L) 3.87 - 5.11 MIL/uL   Hemoglobin 8.1 (L) 12.0 - 15.0 g/dL   HCT 78.4 (L) 69.6 - 29.5 %   MCV 93.4 78.0 - 100.0 fL   MCH 31.5 26.0 - 34.0 pg   MCHC 33.8 30.0 - 36.0 g/dL   RDW 28.4 13.2 - 44.0 %   Platelets 294 150 - 400 K/uL   Neutrophils Relative % 75 %   Neutro Abs 10.0 (H) 1.7 - 7.7 K/uL   Lymphocytes Relative 20 %   Lymphs Abs 2.6 0.7 - 4.0 K/uL   Monocytes Relative 5 %   Monocytes Absolute 0.6 0.1 - 1.0 K/uL   Eosinophils Relative 0 %   Eosinophils Absolute 0.0 0.0 - 0.7 K/uL   Basophils Relative 0 %   Basophils Absolute 0.0 0.0 - 0.1 K/uL  Comprehensive metabolic panel     Status: Abnormal   Collection Time: 02/06/16  9:50 AM  Result Value Ref Range   Sodium 138 135 - 145 mmol/L   Potassium 4.0 3.5 - 5.1 mmol/L   Chloride 102 101 - 111 mmol/L   CO2 25 22 - 32 mmol/L   Glucose, Bld 157 (H) 65 - 99 mg/dL   BUN 47 (H) 6 - 20 mg/dL   Creatinine, Ser 1.02 0.44 - 1.00 mg/dL   Calcium 8.8 (L) 8.9 - 10.3 mg/dL   Total Protein 5.8 (L) 6.5 - 8.1 g/dL   Albumin 3.5 3.5 - 5.0 g/dL   AST 20 15 - 41 U/L   ALT  17 14 - 54 U/L   Alkaline Phosphatase 23 (L) 38 - 126 U/L   Total Bilirubin 0.4 0.3 - 1.2 mg/dL   GFR calc non Af Amer >60 >60 mL/min   GFR calc Af Amer >60 >60 mL/min   Anion gap 11 5 - 15  D-dimer, quantitative (not at [facility])     Status: None   Collection Time: 02/06/16  9:50 AM  Result Value Ref Range   D-Dimer, Quant <0.27 0.00 - 0.50 ug/mL-FEU  I-stat troponin, ED (not at [facility], [facility])     Status: None   Collection Time: 02/06/16  9:58 AM  Result Value Ref Range   Troponin i, poc 0.00 0.00 - 0.08 ng/mL   Comment 3          I-Stat CG4 Lactic Acid, ED     Status: Abnormal   Collection Time: 02/06/16 10:12 AM  Result Value Ref Range   Lactic Acid, Venous 2.94 (HH) 0.5 - 2.0 mmol/L   Comment NOTIFIED PHYSICIAN  Vitamin B12     Status: Abnormal   Collection Time: 02/06/16 11:36 AM  Result Value Ref Range   Vitamin B-12 103 (L) 180 - 914 pg/mL  Folate     Status: None   Collection Time: 02/06/16 11:36 AM  Result Value Ref Range   Folate 14.6 >5.9 ng/mL  Iron and TIBC     Status: None   Collection Time: 02/06/16 11:36 AM  Result Value Ref Range   Iron 58 28 - 170 ug/dL   TIBC 161 096 - 045 ug/dL   Saturation Ratios 17 10.4 - 31.8 %   UIBC 291 ug/dL  Ferritin     Status: None   Collection Time: 02/06/16 11:36 AM  Result Value Ref Range   Ferritin 20 11 - 307 ng/mL  Reticulocytes     Status: Abnormal   Collection Time: 02/06/16 11:36 AM  Result Value Ref Range   Retic Ct Pct 2.3 0.4 - 3.1 %   RBC. 2.37 (L) 3.87 - 5.11 MIL/uL   Retic Count, Manual 54.5 19.0 - 186.0 K/uL  Type and screen     Status: None (Preliminary result)   Collection Time: 02/06/16 11:36 AM  Result Value Ref Range   ABO/RH(D) O NEG    Antibody Screen NEG    Sample Expiration 02/09/2016    Unit Number [id number]    Blood Component Type RED CELLS,LR    Unit division 00    Status of Unit ISSUED    Transfusion Status OK TO TRANSFUSE    Crossmatch Result Compatible    Unit Number [id number]     Blood Component Type RED CELLS,LR    Unit division 00    Status of Unit ALLOCATED    Transfusion Status OK TO TRANSFUSE    Crossmatch Result Compatible    Unit Number [id number]    Blood Component Type RED CELLS,LR    Unit division 00    Status of Unit ALLOCATED    Transfusion Status OK TO TRANSFUSE    Crossmatch Result Compatible   POC occult blood, ED Provider will collect     Status: Abnormal   Collection Time: 02/06/16 11:37 AM  Result Value Ref Range   Fecal Occult Bld POSITIVE (A) NEGATIVE  ABO/Rh     Status: None   Collection Time: 02/06/16 11:37 AM  Result Value Ref Range   ABO/RH(D) O NEG   Prepare RBC     Status: None   Collection Time: 02/06/16 11:37 AM  Result Value Ref Range   Order Confirmation ORDER PROCESSED BY BLOOD BANK   CBC     Status: Abnormal   Collection Time: 02/06/16  8:59 PM  Result Value Ref Range   WBC 11.5 (H) 4.0 - 10.5 K/uL   RBC 2.45 (L) 3.87 - 5.11 MIL/uL   Hemoglobin 7.8 (L) 12.0 - 15.0 g/dL   HCT 01.0 (L) 27.2 - 53.6 %   MCV 93.1 78.0 - 100.0 fL   MCH 31.8 26.0 - 34.0 pg   MCHC 34.2 30.0 - 36.0 g/dL   RDW 64.4 03.4 - 74.2 %   Platelets 208 150 - 400 K/uL  CBC     Status: Abnormal   Collection Time: 02/07/16  5:02 AM  Result Value Ref Range   WBC 8.3 4.0 - 10.5 K/uL   RBC 2.22 (L) 3.87 - 5.11 MIL/uL   Hemoglobin 7.0 (L) 12.0 - 15.0 g/dL   HCT 59.5 (L) 63.8 - 75.6 %   MCV 94.1 78.0 -  100.0 fL   MCH 31.5 26.0 - 34.0 pg   MCHC 33.5 30.0 - 36.0 g/dL   RDW 16.1 09.6 - 04.5 %   Platelets 187 150 - 400 K/uL  Comprehensive metabolic panel     Status: Abnormal   Collection Time: 02/07/16  5:02 AM  Result Value Ref Range   Sodium 142 135 - 145 mmol/L   Potassium 3.9 3.5 - 5.1 mmol/L   Chloride 111 101 - 111 mmol/L   CO2 26 22 - 32 mmol/L   Glucose, Bld 121 (H) 65 - 99 mg/dL   BUN 24 (H) 6 - 20 mg/dL   Creatinine, Ser 4.09 0.44 - 1.00 mg/dL   Calcium 7.8 (L) 8.9 - 10.3 mg/dL   Total Protein 4.6 (L) 6.5 - 8.1 g/dL   Albumin 2.8 (L)  3.5 - 5.0 g/dL   AST 18 15 - 41 U/L   ALT 13 (L) 14 - 54 U/L   Alkaline Phosphatase 16 (L) 38 - 126 U/L   Total Bilirubin 0.4 0.3 - 1.2 mg/dL   GFR calc non Af Amer >60 >60 mL/min   GFR calc Af Amer >60 >60 mL/min   Anion gap 5 5 - 15  Prepare RBC     Status: None   Collection Time: 02/07/16  9:30 AM  Result Value Ref Range   Order Confirmation ORDER PROCESSED BY BLOOD BANK     Studies/Results: Dg Chest Port 1 View  02/06/2016  CLINICAL DATA:  Shortness of breath since Wednesday, worsening. EXAM: PORTABLE CHEST 1 VIEW COMPARISON:  07/24/2008 FINDINGS: The heart size and mediastinal contours are within normal limits. Both lungs are clear. The visualized skeletal structures are unremarkable. IMPRESSION: No active disease. Electronically Signed   By: Charlett Nose M.D.   On: 02/06/2016 10:08      Assessment: Upper GI bleeding, clinically stable but significant fall in hemoglobin  Plan: EGD today. Continue PPI.    Twanda Stakes C 02/07/2016, 9:34 AM  Pager [phone] If no answer or after 5 PM call [phone]

## 2016-02-07 NOTE — Op Note (Signed)
[facility] [address] [place], [address]   ENDOSCOPY PROCEDURE REPORT  PATIENT: Marie Carpenter, Marie Carpenter  MR#: [id number] BIRTHDATE: 1941-10-08 , 74  yrs. old GENDER: female ENDOSCOPIST: Dorena Cookey, MD REFERRED BY: PROCEDURE DATE:  02/07/2016 PROCEDURE: ASA CLASS: INDICATIONS:   GI bleeding MEDICATIONS:  fentanyl 50 g, Versed 4 mg TOPICAL ANESTHETIC: Cetacaine  DESCRIPTION OF PROCEDURE: After the risks benefits and alternatives of the procedure were thoroughly explained, informed consent was obtained.  The Pentax Gastroscope [id number] endoscope was introduced through the mouth and advanced to the second portion of the duodenum , Without limitations.  The instrument was slowly withdrawn as the mucosa was fully examined.    Barrett's esophagus with hiatal herniaapproximately 4 cm segment. This was not biopsied stomach toantral ulcers along the lesser curvature both clean-based with surrounding erythema and 2 or 3 small erosions no stigmata of hemorrhage Duodenum normal              The scope was then withdrawn from the patient and the procedure completed.  COMPLICATIONS: There were no immediate complications.  ENDOSCOPIC IMPRESSION 2 gastric ulcers without stigmata of hemorrhage currently Barrett's esophagus hiatal hernia:   RECOMMENDATIONS: double dose PPI Check H. pylori antibody Repeat EGD in 2 months to assess healing and to obtain Barrett's biopsies.  REPEAT EXAM:  eSigned:  Dorena Cookey, MD 02/07/2016 2:03 PM    CC:  PATIENT NAME:  [name], [name] MR#: [id number]

## 2016-02-08 ENCOUNTER — Encounter (HOSPITAL_COMMUNITY): Payer: Self-pay | Admitting: Gastroenterology

## 2016-02-08 DIAGNOSIS — K254 Chronic or unspecified gastric ulcer with hemorrhage: Secondary | ICD-10-CM

## 2016-02-08 DIAGNOSIS — I10 Essential (primary) hypertension: Secondary | ICD-10-CM | POA: Diagnosis not present

## 2016-02-08 DIAGNOSIS — D62 Acute posthemorrhagic anemia: Secondary | ICD-10-CM | POA: Diagnosis not present

## 2016-02-08 DIAGNOSIS — K22719 Barrett's esophagus with dysplasia, unspecified: Secondary | ICD-10-CM | POA: Diagnosis not present

## 2016-02-08 DIAGNOSIS — K922 Gastrointestinal hemorrhage, unspecified: Secondary | ICD-10-CM | POA: Diagnosis not present

## 2016-02-08 LAB — TYPE AND SCREEN
ABO/RH(D): O NEG
Antibody Screen: NEGATIVE
UNIT DIVISION: 0
UNIT DIVISION: 0
UNIT DIVISION: 0

## 2016-02-08 LAB — CBC
HCT: 28.6 % — ABNORMAL LOW (ref 36.0–46.0)
Hemoglobin: 9.4 g/dL — ABNORMAL LOW (ref 12.0–15.0)
MCH: 30.1 pg (ref 26.0–34.0)
MCHC: 32.9 g/dL (ref 30.0–36.0)
MCV: 91.7 fL (ref 78.0–100.0)
Platelets: 197 K/uL (ref 150–400)
RBC: 3.12 MIL/uL — ABNORMAL LOW (ref 3.87–5.11)
RDW: 15.8 % — ABNORMAL HIGH (ref 11.5–15.5)
WBC: 9.4 K/uL (ref 4.0–10.5)

## 2016-02-08 MED ORDER — PANTOPRAZOLE SODIUM 40 MG PO TBEC: 40.0000 mg | DELAYED_RELEASE_TABLET | Freq: Two times a day (BID) | ORAL | Status: DC

## 2016-02-08 NOTE — Discharge Summary (Signed)
Physician Discharge Summary  Marie Carpenter [place] ZOX:[id number] DOB: [birth year] DOA: 10-Feb-2016  PCP: Cala Bradford, MD  Admit date: 2016-02-10 Discharge date: 02/08/2016   Recommendations for Outpatient Follow-Up:   1. The patient will follow-up with GI in 4 weeks. She will need a repeat EGD with biopsies of esophagus given findings of Barrett's esophagus on EGD. Follow up H. pylori testing.   Discharge Diagnosis:   Principal Problem:    UGI bleed secondary to peptic ulcer disease Active Problems:    Melena    Osteoporosis    Essential hypertension    Acute blood loss anemia    Hypotension    Leukocytosis    Hiatal hernia    PUD (peptic ulcer disease)    Barrett's esophagus    GI bleed   Discharge disposition:  Home.   Discharge Condition: Improved.  Diet recommendation: Low sodium, heart healthy.  C  History of Present Illness:   Marie Carpenter is an 75 y.o. female with a past medical history of hypertension, osteoporosis, diverticulosis, who was in her usual state of health till about 3 days ago when she noticed that she was getting dizzy and short of breath especially with exertion, and presented to the ED for evaluation on 2016-02-10. In the emergency department, patient was found to be anemic and hypotensive. She was found to have black stool on rectal examination, which was heme positive. GI was subsequently consulted.   Hospital Course by Problem:   Principal Problem:  UGI bleed/melena/hypotension/acute blood loss anemia/PUD - Hemoglobin 9.4 status post 2 units of PRBCs. - EGD done 02/07/16: 2 peptic ulcers. F/U H. Pylori. - Will need repeat EGD in 2 months to assess healing and obtain biopsies of Barrett's esophagus. - Diet advanced 02/07/16.   Hiatal hernia/Barrett's Esophagus - Double dose PPI. - As above.  Active Problems:  Osteoporosis - Bisphosphonate on hold. Okay to resume post discharge.   Essential hypertension - Hold  antihypertensives.   Leukocytosis - Resolved.    Medical Consultants:    Dr. Dorena Cookey, Gastroenterology   Discharge Exam:   Filed Vitals:   02/08/16 0600 02/08/16 1408  BP: 132/62 166/65  Pulse: 64 79  Temp: 97.8 F (36.6 C) 98.4 F (36.9 C)  Resp: 18 18   Filed Vitals:   02/07/16 2050 02/07/16 2312 02/08/16 0600 02/08/16 1408  BP: 115/45 106/69 132/62 166/65  Pulse: 67 64 64 79  Temp: 97.6 F (36.4 C) 97.7 F (36.5 C) 97.8 F (36.6 C) 98.4 F (36.9 C)  TempSrc: Oral Oral Oral Oral  Resp: Height:      Weight:      SpO2:  100% 99% 100%    Gen:  NAD Cardiovascular:  RRR, No M/R/G Respiratory: Lungs CTAB Gastrointestinal: Abdomen soft, NT/ND with normal active bowel sounds. Extremities: No C/E/C   The results of significant diagnostics from this hospitalization (including imaging, microbiology, ancillary and laboratory) are listed below for reference.     Procedures and Diagnostic Studies:   Dg Chest Port 1 View  2016-02-10  CLINICAL DATA:  Shortness of breath since Wednesday, worsening. EXAM: PORTABLE CHEST 1 VIEW COMPARISON:  07/24/2008 FINDINGS: The heart size and mediastinal contours are within normal limits. Both lungs are clear. The visualized skeletal structures are unremarkable. IMPRESSION: No active disease. Electronically Signed   By: Charlett Nose M.D.   On: 2016/02/10 10:08   EGD 02/07/16  ENDOSCOPIC IMPRESSION 2 gastric ulcers without stigmata of hemorrhage currently Barrett's  esophagus hiatal hernia:   RECOMMENDATIONS: double dose PPI Check H. pylori antibody Repeat EGD in 2 months to assess healing and to obtain Barrett's biopsies.  Labs:   Basic Metabolic Panel:  Recent Labs Lab 02/06/16 0950 02/07/16 0502  NA 138 142  K 4.0 3.9  CL 102 111  CO2 25 26  GLUCOSE 157* 121*  BUN 47* 24*  CREATININE 0.79 0.54  CALCIUM 8.8* 7.8*   GFR Estimated Creatinine Clearance: 60 mL/min (by C-G formula based on Cr of  0.54). Liver Function Tests:  Recent Labs Lab 02/06/16 0950 02/07/16 0502  AST 20 18  ALT 17 13*  ALKPHOS 23* 16*  BILITOT 0.4 0.4  PROT 5.8* 4.6*  ALBUMIN 3.5 2.8*   CBC:  Recent Labs Lab 02/06/16 0950 02/06/16 2059 02/07/16 0502 02/08/16 0216  WBC 13.3* 11.5* 8.3 9.4  NEUTROABS 10.0*  --   --   --   HGB 8.1* 7.8* 7.0* 9.4*  HCT 24.0* 22.8* 20.9* 28.6*  MCV 93.4 93.1 94.1 91.7  PLT 294 208 187 197   D-Dimer  Recent Labs  02/06/16 0950  DDIMER <0.27   Anemia work up  Recent Labs  02/06/16 1136  VITAMINB12 103*  FOLATE 14.6  FERRITIN 20  TIBC 349  IRON 58  RETICCTPCT 2.3   Microbiology No results found for this or any previous visit (from the past 240 hour(s)).   Discharge Instructions:   Discharge Instructions    Call MD for:  extreme fatigue    Complete by:  As directed      Call MD for:  persistant nausea and vomiting    Complete by:  As directed      Call MD for:  severe uncontrolled pain    Complete by:  As directed      Call MD for:    Complete by:  As directed   Recurrent black or bloody stools.     Diet - low sodium heart healthy    Complete by:  As directed      Discharge instructions    Complete by:  As directed   Avoid any over-the-counter pain relievers except for tylenol (no aspirin, Motrin, Aleve, Ibuprofen, Naprosyn, etc).  Call your doctor for any recurrent black or bloody stools, dizziness, weakness, or feeling like you might pass out.  You will need to follow up with Dr. Madilyn Fireman for evaluation of Barrett's esophagus.     Increase activity slowly    Complete by:  As directed             Medication List    TAKE these medications        alendronate 70 MG tablet  Commonly known as:  FOSAMAX  Take 70 mg by mouth once a week. Take with a full glass of water on an empty stomach.     amLODipine 2.5 MG tablet  Commonly known as:  NORVASC  Take 2.5 mg by mouth daily.     cholecalciferol 1000 units tablet  Commonly known  as:  VITAMIN D  Take 1,000 Units by mouth daily.     lisinopril 20 MG tablet  Commonly known as:  PRINIVIL,ZESTRIL  Take 20 mg by mouth daily.     pantoprazole 40 MG tablet  Commonly known as:  PROTONIX  Take 1 tablet (40 mg total) by mouth 2 (two) times daily.           Follow-up Information    Schedule an appointment as soon as possible  for a visit with Cala Bradford, MD.   Specialty:  Family Medicine   Why:  As needed, If symptoms worsen   Contact information:   [address] [place] [address] [phone]       Follow up with HAYES,JOHN C, MD. Schedule an appointment as soon as possible for a visit in 2 months.   Specialty:  Gastroenterology   Why:  Repeat endoscopy and obtain biopsies of Barrett's esophagus.   Contact information:   [address] [place] [address] [phone]        Time coordinating discharge: 35 minutes.  Signed:  Phyllip Claw  Pager [phone] [facility] 02/08/2016, 3:12 PM      02/08/2016    Re: Jeraldine Loots    The above named individual was in the hospital from 02/06/2016-02/08/2016 under my care.  Please excuse her daughter, Deatra Ina, from work during this time, note, she was unable to travel during these dates.   Dr. Trula Ore Lambert Jeanty  [facility]

## 2016-02-08 NOTE — Progress Notes (Signed)
[facility] PROGRESS NOTE Subjective Hg 9.4 w/o further bleeding. EGD showed GUs and Barrett's  Objective: Vital signs in last 24 hours: Temp:  [97.6 F (36.4 C)-98.2 F (36.8 C)] 97.8 F (36.6 C) (02/27 0600) Pulse Rate:  [64-87] 64 (02/27 0600) Resp:  [13-20] 18 (02/27 0600) BP: (106-160)/(33-86) 132/62 mmHg (02/27 0600) SpO2:  [96 %-100 %] 99 % (02/27 0600) Last BM Date: 02/07/16  Intake/Output from previous day: 02/26 0701 - 02/27 0700 In: 1179 [I.V.:500; Blood:679] Out: -  Intake/Output this shift:    PE: General--no distress eating lunch   Lab Results:  Recent Labs  02/06/16 0950 02/06/16 2059 02/07/16 0502 02/08/16 0216  WBC 13.3* 11.5* 8.3 9.4  HGB 8.1* 7.8* 7.0* 9.4*  HCT 24.0* 22.8* 20.9* 28.6*  PLT 294 208 187 197   BMET  Recent Labs  02/06/16 0950 02/07/16 0502  NA 138 142  K 4.0 3.9  CL 102 111  CO2 25 26  CREATININE 0.79 0.54   LFT  Recent Labs  02/06/16 0950 02/07/16 0502  PROT 5.8* 4.6*  AST 20 18  ALT 17 13*  ALKPHOS 23* 16*  BILITOT 0.4 0.4   PT/INR No results for input(s): LABPROT, INR in the last 72 hours. PANCREAS No results for input(s): LIPASE in the last 72 hours.       Studies/Results: No results found.  Medications: I have reviewed the patient's current medications.  Assessment/Plan: 1. GUs. Hg up w/o bleeding on EGD OK to discharge home on double dose PPI. Long discussion with pt about need for repeat EGD to document healing, Bx Barrett's, and need to avoid NSAIDs etc. Suggest f/u appointment with Dr Madilyn Fireman in about 6 weeks.   Wilmer Berryhill JR,Blythe Veach L 02/08/2016, 1:32 PM  This note was created using voice recognition software. Minor errors may Have occurred unintentionally.  Pager: [phone] If no answer or after hours call [phone]

## 2016-02-09 LAB — H. PYLORI ANTIBODY, IGG: H Pylori IgG: <0.9 U/mL (ref 0.0–0.8)

## 2016-04-28 ENCOUNTER — Other Ambulatory Visit: Payer: Self-pay | Admitting: Gastroenterology

## 2016-06-03 ENCOUNTER — Other Ambulatory Visit: Payer: Self-pay | Admitting: Family Medicine

## 2016-06-03 ENCOUNTER — Ambulatory Visit
Admission: RE | Admit: 2016-06-03 | Discharge: 2016-06-03 | Disposition: A | Discharge status: Home / Self Care | Payer: Medicare Other | Source: Ambulatory Visit | Attending: Family Medicine | Admitting: Family Medicine

## 2016-06-03 DIAGNOSIS — M79671 Pain in right foot: Secondary | ICD-10-CM

## 2016-12-23 ENCOUNTER — Other Ambulatory Visit: Payer: Self-pay | Admitting: Family Medicine

## 2016-12-23 DIAGNOSIS — M7989 Other specified soft tissue disorders: Secondary | ICD-10-CM

## 2016-12-26 ENCOUNTER — Emergency Department (HOSPITAL_COMMUNITY): Payer: Medicare Other

## 2016-12-26 ENCOUNTER — Inpatient Hospital Stay (HOSPITAL_COMMUNITY)
Admission: EM | Admit: 2016-12-26 | Discharge: 2016-12-28 | DRG: 872 | Disposition: A | Discharge status: Home / Self Care | Payer: Medicare Other | Attending: Internal Medicine | Admitting: Internal Medicine

## 2016-12-26 ENCOUNTER — Encounter (HOSPITAL_COMMUNITY): Payer: Self-pay | Admitting: *Deleted

## 2016-12-26 DIAGNOSIS — Z880 Allergy status to penicillin: Secondary | ICD-10-CM

## 2016-12-26 DIAGNOSIS — K2270 Barrett's esophagus without dysplasia: Secondary | ICD-10-CM | POA: Diagnosis present

## 2016-12-26 DIAGNOSIS — K259 Gastric ulcer, unspecified as acute or chronic, without hemorrhage or perforation: Secondary | ICD-10-CM | POA: Diagnosis present

## 2016-12-26 DIAGNOSIS — K922 Gastrointestinal hemorrhage, unspecified: Secondary | ICD-10-CM | POA: Diagnosis not present

## 2016-12-26 DIAGNOSIS — Z8249 Family history of ischemic heart disease and other diseases of the circulatory system: Secondary | ICD-10-CM

## 2016-12-26 DIAGNOSIS — K22719 Barrett's esophagus with dysplasia, unspecified: Secondary | ICD-10-CM | POA: Diagnosis not present

## 2016-12-26 DIAGNOSIS — K921 Melena: Secondary | ICD-10-CM | POA: Diagnosis present

## 2016-12-26 DIAGNOSIS — Z820 Family history of epilepsy and other diseases of the nervous system: Secondary | ICD-10-CM | POA: Diagnosis not present

## 2016-12-26 DIAGNOSIS — Z887 Allergy status to serum and vaccine status: Secondary | ICD-10-CM

## 2016-12-26 DIAGNOSIS — A419 Sepsis, unspecified organism: Principal | ICD-10-CM | POA: Diagnosis present

## 2016-12-26 DIAGNOSIS — K5150 Left sided colitis without complications: Secondary | ICD-10-CM | POA: Diagnosis present

## 2016-12-26 DIAGNOSIS — K529 Noninfective gastroenteritis and colitis, unspecified: Secondary | ICD-10-CM | POA: Diagnosis present

## 2016-12-26 DIAGNOSIS — Z87891 Personal history of nicotine dependence: Secondary | ICD-10-CM

## 2016-12-26 DIAGNOSIS — E876 Hypokalemia: Secondary | ICD-10-CM | POA: Diagnosis present

## 2016-12-26 DIAGNOSIS — M810 Age-related osteoporosis without current pathological fracture: Secondary | ICD-10-CM | POA: Diagnosis present

## 2016-12-26 DIAGNOSIS — R531 Weakness: Secondary | ICD-10-CM | POA: Diagnosis present

## 2016-12-26 DIAGNOSIS — E860 Dehydration: Secondary | ICD-10-CM | POA: Diagnosis present

## 2016-12-26 DIAGNOSIS — I10 Essential (primary) hypertension: Secondary | ICD-10-CM | POA: Diagnosis not present

## 2016-12-26 DIAGNOSIS — Z79899 Other long term (current) drug therapy: Secondary | ICD-10-CM

## 2016-12-26 DIAGNOSIS — Z8711 Personal history of peptic ulcer disease: Secondary | ICD-10-CM | POA: Diagnosis not present

## 2016-12-26 DIAGNOSIS — D62 Acute posthemorrhagic anemia: Secondary | ICD-10-CM | POA: Diagnosis not present

## 2016-12-26 LAB — I-STAT CHEM 8, ED
BUN: 60 mg/dL — ABNORMAL HIGH (ref 6–20)
CALCIUM ION: 1.17 mmol/L (ref 1.15–1.40)
CHLORIDE: 100 mmol/L — AB (ref 101–111)
Creatinine, Ser: 0.8 mg/dL (ref 0.44–1.00)
Glucose, Bld: 130 mg/dL — ABNORMAL HIGH (ref 65–99)
HCT: 37 % (ref 36.0–46.0)
Hemoglobin: 12.6 g/dL (ref 12.0–15.0)
Potassium: 4.5 mmol/L (ref 3.5–5.1)
SODIUM: 138 mmol/L (ref 135–145)
TCO2: 30 mmol/L (ref 0–100)

## 2016-12-26 LAB — ABO/RH: ABO/RH(D): O NEG

## 2016-12-26 LAB — COMPREHENSIVE METABOLIC PANEL
ALT: 18 U/L (ref 14–54)
AST: 22 U/L (ref 15–41)
Albumin: 3.3 g/dL — ABNORMAL LOW (ref 3.5–5.0)
Alkaline Phosphatase: 31 U/L — ABNORMAL LOW (ref 38–126)
Anion gap: 8 (ref 5–15)
BUN: 47 mg/dL — AB (ref 6–20)
CHLORIDE: 103 mmol/L (ref 101–111)
CO2: 25 mmol/L (ref 22–32)
Calcium: 8.9 mg/dL (ref 8.9–10.3)
Creatinine, Ser: 0.9 mg/dL (ref 0.44–1.00)
GFR calc Af Amer: >60 mL/min (ref >60)
GFR calc non Af Amer: >60 mL/min (ref >60)
Glucose, Bld: 123 mg/dL — ABNORMAL HIGH (ref 65–99)
Potassium: 3.4 mmol/L — ABNORMAL LOW (ref 3.5–5.1)
SODIUM: 136 mmol/L (ref 135–145)
Total Bilirubin: 0.4 mg/dL (ref 0.3–1.2)
Total Protein: 5.7 g/dL — ABNORMAL LOW (ref 6.5–8.1)

## 2016-12-26 LAB — PREPARE RBC (CROSSMATCH)

## 2016-12-26 LAB — I-STAT CG4 LACTIC ACID, ED
LACTIC ACID, VENOUS: 1.22 mmol/L (ref 0.5–1.9)
Lactic Acid, Venous: 2.37 mmol/L (ref 0.5–1.9)

## 2016-12-26 LAB — PROTIME-INR
INR: 0.96
PROTHROMBIN TIME: 12.8 s (ref 11.4–15.2)

## 2016-12-26 LAB — APTT: aPTT: 28 seconds (ref 24–36)

## 2016-12-26 LAB — CBC WITH DIFFERENTIAL/PLATELET
BASOS ABS: 0 10*3/uL (ref 0.0–0.1)
Basophils Relative: 0 %
Eosinophils Absolute: 0 10*3/uL (ref 0.0–0.7)
Eosinophils Relative: 0 %
HEMATOCRIT: 33 % — AB (ref 36.0–46.0)
Hemoglobin: 10.8 g/dL — ABNORMAL LOW (ref 12.0–15.0)
LYMPHS PCT: 9 %
Lymphs Abs: 1.6 10*3/uL (ref 0.7–4.0)
MCH: 30.9 pg (ref 26.0–34.0)
MCHC: 32.7 g/dL (ref 30.0–36.0)
MCV: 94.6 fL (ref 78.0–100.0)
MONO ABS: 1.2 10*3/uL — AB (ref 0.1–1.0)
Monocytes Relative: 7 %
NEUTROS ABS: 14.6 10*3/uL — AB (ref 1.7–7.7)
Neutrophils Relative %: 84 %
Platelets: 274 10*3/uL (ref 150–400)
RBC: 3.49 MIL/uL — AB (ref 3.87–5.11)
RDW: 13.7 % (ref 11.5–15.5)
WBC: 17.4 10*3/uL — ABNORMAL HIGH (ref 4.0–10.5)

## 2016-12-26 LAB — POC OCCULT BLOOD, ED: FECAL OCCULT BLD: POSITIVE — AB

## 2016-12-26 LAB — CBC
HCT: 28.6 % — ABNORMAL LOW (ref 36.0–46.0)
Hemoglobin: 9.5 g/dL — ABNORMAL LOW (ref 12.0–15.0)
MCH: 31.5 pg (ref 26.0–34.0)
MCHC: 33.2 g/dL (ref 30.0–36.0)
MCV: 94.7 fL (ref 78.0–100.0)
PLATELETS: 239 10*3/uL (ref 150–400)
RBC: 3.02 MIL/uL — AB (ref 3.87–5.11)
RDW: 13.9 % (ref 11.5–15.5)
WBC: 13.7 10*3/uL — AB (ref 4.0–10.5)

## 2016-12-26 LAB — I-STAT TROPONIN, ED: Troponin i, poc: 0 ng/mL (ref 0.00–0.08)

## 2016-12-26 LAB — HEMOGLOBIN AND HEMATOCRIT, BLOOD
HCT: 32.1 % — ABNORMAL LOW (ref 36.0–46.0)
Hemoglobin: 10.9 g/dL — ABNORMAL LOW (ref 12.0–15.0)

## 2016-12-26 MED ORDER — ACETAMINOPHEN 325 MG PO TABS
650.0000 mg | ORAL_TABLET | Freq: Four times a day (QID) | ORAL | Status: DC | PRN
  Administered 2016-12-27: 650 mg via ORAL
  Filled 2016-12-26: qty 2

## 2016-12-26 MED ORDER — SODIUM CHLORIDE 0.9 % IV BOLUS (SEPSIS)
1000.0000 mL | Freq: Once | INTRAVENOUS | Status: AC
  Administered 2016-12-26: 1000 mL via INTRAVENOUS

## 2016-12-26 MED ORDER — SODIUM CHLORIDE 0.9 % IV SOLN: 10.0000 mL/h | Freq: Once | INTRAVENOUS | Status: DC

## 2016-12-26 MED ORDER — LEVOFLOXACIN IN D5W 750 MG/150ML IV SOLN
750.0000 mg | INTRAVENOUS | Status: DC
  Administered 2016-12-27: 750 mg via INTRAVENOUS
  Filled 2016-12-26: qty 150

## 2016-12-26 MED ORDER — METRONIDAZOLE IN NACL 5-0.79 MG/ML-% IV SOLN
500.0000 mg | Freq: Once | INTRAVENOUS | Status: AC
  Administered 2016-12-26: 500 mg via INTRAVENOUS
  Filled 2016-12-26: qty 100

## 2016-12-26 MED ORDER — SODIUM CHLORIDE 0.9 % IV SOLN
80.0000 mg | Freq: Once | INTRAVENOUS | Status: AC
  Administered 2016-12-26: 80 mg via INTRAVENOUS
  Filled 2016-12-26: qty 80

## 2016-12-26 MED ORDER — POTASSIUM CHLORIDE CRYS ER 20 MEQ PO TBCR
40.0000 meq | EXTENDED_RELEASE_TABLET | Freq: Once | ORAL | Status: AC
  Administered 2016-12-27: 40 meq via ORAL
  Filled 2016-12-26: qty 2

## 2016-12-26 MED ORDER — SODIUM CHLORIDE 0.9 % IV SOLN
INTRAVENOUS | Status: DC
  Administered 2016-12-26 – 2016-12-28 (×5): via INTRAVENOUS

## 2016-12-26 MED ORDER — SODIUM CHLORIDE 0.9 % IV BOLUS (SEPSIS): 1000.0000 mL | Freq: Once | INTRAVENOUS | Status: DC

## 2016-12-26 MED ORDER — LEVOFLOXACIN IN D5W 750 MG/150ML IV SOLN
750.0000 mg | Freq: Once | INTRAVENOUS | Status: AC
  Administered 2016-12-26: 750 mg via INTRAVENOUS
  Filled 2016-12-26: qty 150

## 2016-12-26 MED ORDER — PANTOPRAZOLE SODIUM 40 MG IV SOLR
40.0000 mg | Freq: Two times a day (BID) | INTRAVENOUS | Status: DC
  Administered 2016-12-27 – 2016-12-28 (×3): 40 mg via INTRAVENOUS
  Filled 2016-12-26 (×3): qty 40

## 2016-12-26 MED ORDER — ONDANSETRON HCL 4 MG/2ML IJ SOLN: 4.0000 mg | Freq: Four times a day (QID) | INTRAMUSCULAR | Status: DC | PRN

## 2016-12-26 MED ORDER — METRONIDAZOLE IN NACL 5-0.79 MG/ML-% IV SOLN
500.0000 mg | Freq: Three times a day (TID) | INTRAVENOUS | Status: DC
  Administered 2016-12-27 – 2016-12-28 (×5): 500 mg via INTRAVENOUS
  Filled 2016-12-26 (×5): qty 100

## 2016-12-26 MED ORDER — IOPAMIDOL (ISOVUE-300) INJECTION 61%
100.0000 mL | Freq: Once | INTRAVENOUS | Status: AC | PRN
  Administered 2016-12-26: 100 mL via INTRAVENOUS

## 2016-12-26 MED ORDER — ACETAMINOPHEN 650 MG RE SUPP: 650.0000 mg | Freq: Four times a day (QID) | RECTAL | Status: DC | PRN

## 2016-12-26 MED ORDER — SODIUM CHLORIDE 0.9 % IV BOLUS (SEPSIS)
500.0000 mL | Freq: Once | INTRAVENOUS | Status: AC
  Administered 2016-12-26: 500 mL via INTRAVENOUS

## 2016-12-26 MED ORDER — ONDANSETRON HCL 4 MG PO TABS: 4.0000 mg | ORAL_TABLET | Freq: Four times a day (QID) | ORAL | Status: DC | PRN

## 2016-12-26 NOTE — ED Provider Notes (Signed)
[facility]-EMERGENCY DEPT Provider Note   CSN: [id number] Arrival date & time: 12/26/16  1529     History   Chief Complaint Chief Complaint  Patient presents with  . Diarrhea    HPI Marie Carpenter is a 76 y.o. female.  HPI Marie Carpenter is a 76 y.o. female with PMH significant for barrett's esophagus, PUD, HTN, and UGIB last year who presents with 4 episodes of black diarrhea today with associated generalized weakness, SOB, fatigue, and one episode of now resolved abdominal cramping.  She has a hx of UGIB last year due 2 gastric ulcers.  She is followed by Dr. Madilyn Fireman.  She reports receiving 3 units PRBCs.  She denies any anticoagulation use, antiplatelet use, NSAIDs, liver disease, or heavy alcohol use.  She has not taken anything for her symptoms.  Per EMS, she had no palpable radial pulses and a BP of 80 systolic.  She states this is similar to her prior UGIB presentation. She has no chest pain. No hematemesis. She mentions bilateral lower neck pain and shoulder tightness, but this has been ongoing for "quite sometime now" and she attributes this as to how she holds her iPad.  Her primary concern is the weakness and black diarrhea.   Past Medical History:  Diagnosis Date  . Barrett's esophagus 02/07/2016  . Essential hypertension 02/06/2016  . Hiatal hernia 02/07/2016  . Hypertension   . Osteoporosis 02/06/2016  . PUD (peptic ulcer disease) 02/07/2016  . UGI bleed 02/06/2016    Patient Active Problem List   Diagnosis Date Noted  . Upper GI bleed 12/26/2016  . GI bleed 02/08/2016  . Hiatal hernia 02/07/2016  . PUD (peptic ulcer disease) 02/07/2016  . Barrett's esophagus 02/07/2016  . Osteoporosis 02/06/2016  . Essential hypertension 02/06/2016    Past Surgical History:  Procedure Laterality Date  . ESOPHAGOGASTRODUODENOSCOPY Left 02/07/2016   Procedure: ESOPHAGOGASTRODUODENOSCOPY (EGD);  Surgeon: Dorena Cookey, MD;  Location: [facility];  Service: Endoscopy;  Laterality: Left;     OB History    No data available       Home Medications    Prior to Admission medications   Medication Sig Start Date End Date Taking? Authorizing Provider  amLODipine (NORVASC) 2.5 MG tablet Take 2.5 mg by mouth daily. 12/27/15  Yes Historical Provider, MD  cholecalciferol (VITAMIN D) 1000 units tablet Take 2,000 Units by mouth daily.    Yes Historical Provider, MD  lisinopril (PRINIVIL,ZESTRIL) 20 MG tablet Take 20 mg by mouth daily. 12/27/15  Yes Historical Provider, MD  Magnesium Gluconate (MAGNESIUM 27) 500 (27 Mg) MG TABS Take 27 mg by mouth daily.   Yes Historical Provider, MD    Family History Family History  Problem Relation Age of Onset  . Heart disease Father   . Alzheimer's disease Mother     Social History Social History  Substance Use Topics  . Smoking status: Former Games developer  . Smokeless tobacco: Not on file  . Alcohol use No     Allergies   Hctz [hydrochlorothiazide] and Penicillins   Review of Systems Review of Systems All other systems negative unless otherwise stated in HPI   Physical Exam Updated Vital Signs BP 131/57   Pulse 92   Temp 97.3 F (36.3 C) (Oral)   Resp 18   SpO2 100%   Physical Exam  Constitutional: She is oriented to person, place, and time. She appears well-developed and well-nourished.  Non-toxic appearance. She does not have a sickly appearance. She does not appear  ill.  She is cool to touch.   HENT:  Head: Normocephalic and atraumatic.  Mouth/Throat: Oropharynx is clear and moist.  Eyes: Conjunctivae are normal. Pupils are equal, round, and reactive to light.  Neck: Normal range of motion. Neck supple.  Cardiovascular: Normal rate and regular rhythm.   Pulmonary/Chest: Effort normal and breath sounds normal. No accessory muscle usage or stridor. No respiratory distress. She has no wheezes. She has no rhonchi. She has no rales.  Abdominal: Soft. Bowel sounds are normal. She exhibits no distension. There is no  tenderness.  Genitourinary:  Genitourinary Comments: Chaperone present during exam. Grossly black stool on DRE.  Musculoskeletal: Normal range of motion.  Lymphadenopathy:    She has no cervical adenopathy.  Neurological: She is alert and oriented to person, place, and time.  Speech clear without dysarthria.  Skin: Skin is warm and dry.  Psychiatric: She has a normal mood and affect. Her behavior is normal.     ED Treatments / Results  Labs (all labs ordered are listed, but only abnormal results are displayed) Labs Reviewed  CBC WITH DIFFERENTIAL/PLATELET - Abnormal; Notable for the following:       Result Value   WBC 17.4 (*)    RBC 3.49 (*)    Hemoglobin 10.8 (*)    HCT 33.0 (*)    Neutro Abs 14.6 (*)    Monocytes Absolute 1.2 (*)    All other components within normal limits  COMPREHENSIVE METABOLIC PANEL - Abnormal; Notable for the following:    Potassium 3.4 (*)    Glucose, Bld 123 (*)    BUN 47 (*)    Total Protein 5.7 (*)    Albumin 3.3 (*)    Alkaline Phosphatase 31 (*)    All other components within normal limits  HEMOGLOBIN AND HEMATOCRIT, BLOOD - Abnormal; Notable for the following:    Hemoglobin 10.9 (*)    HCT 32.1 (*)    All other components within normal limits  POC OCCULT BLOOD, ED - Abnormal; Notable for the following:    Fecal Occult Bld POSITIVE (*)    All other components within normal limits  I-STAT CG4 LACTIC ACID, ED - Abnormal; Notable for the following:    Lactic Acid, Venous 2.37 (*)    All other components within normal limits  I-STAT CHEM 8, ED - Abnormal; Notable for the following:    Chloride 100 (*)    BUN 60 (*)    Glucose, Bld 130 (*)    All other components within normal limits  URINE CULTURE  CULTURE, BLOOD (ROUTINE X 2)  CULTURE, BLOOD (ROUTINE X 2)  PROTIME-INR  APTT  URINALYSIS, ROUTINE W REFLEX MICROSCOPIC  I-STAT TROPOININ, ED  I-STAT CG4 LACTIC ACID, ED  TYPE AND SCREEN  ABO/RH  PREPARE RBC (CROSSMATCH)    EKG   EKG Interpretation  Date/Time:  Monday December 26 2016 15:41:36 EST Ventricular Rate:  77 PR Interval:    QRS Duration: 109 QT Interval:  333 QTC Calculation: 377 R Axis:   67 Text Interpretation:  Sinus rhythm Nonspecific T abnormalities, lateral leads No significant change since last tracing Confirmed by ISAACS MD, CAMERON [phone]) on 12/26/2016 3:51:33 PM       Radiology Ct Abdomen Pelvis W Contrast  Result Date: 12/26/2016 CLINICAL DATA:  Bloody stools and dizziness today. History of diverticulosis. EXAM: CT ABDOMEN AND PELVIS WITH CONTRAST TECHNIQUE: Multidetector CT imaging of the abdomen and pelvis was performed using the standard protocol following bolus administration  of intravenous contrast. CONTRAST:  100mL ISOVUE-300 IOPAMIDOL (ISOVUE-300) INJECTION 61% COMPARISON:  CT abdomen and pelvis Apr 25, 2008 FINDINGS: LOWER CHEST: Lung bases are clear. Included heart size is normal. No pericardial effusion. Small LEFT fat containing diaphragmatic hernia. HEPATOBILIARY: Status post cholecystectomy.  Liver is normal. PANCREAS: Normal. SPLEEN: Normal. ADRENALS/URINARY TRACT: Kidneys are orthotopic, demonstrating symmetric enhancement. No nephrolithiasis, hydronephrosis or solid renal masses. Too small to characterize stable LEFT interpolar hypodensity. The unopacified ureters are normal in course and caliber. Delayed imaging through the kidneys demonstrates symmetric prompt contrast excretion within the proximal urinary collecting system. Urinary bladder is well distended and unremarkable. Normal adrenal glands. STOMACH/BOWEL: Small hiatal hernia. Evaluation for bowel pathology decreased without enteric contrast. Contiguous distal descending and rectosigmoid wall thickening and mucosal enhancement. Moderate sigmoid diverticulosis. Colonic and small bowel air-fluid levels. Proximal due weight Tinel diverticulum. In VASCULAR/LYMPHATIC: Aortoiliac vessels are normal in course and caliber, mild calcific  atherosclerosis. No lymphadenopathy by CT size criteria. REPRODUCTIVE: Normal. OTHER: No intraperitoneal free fluid or free air. MUSCULOSKELETAL: Nonacute. Mild degenerative change of the hips. LEFT lateral abdominal wall subcutaneous 2.3 x 3.9 cm globular low-density mass. Minimal grade 1 L4-5 anterolisthesis on degenerative basis. IMPRESSION: LEFT colitis. Moderate sigmoid diverticulosis without acute diverticulitis. LEFT lateral abdominal wall probable degenerating hematoma. Electronically Signed   By: Awilda Metroourtnay  Bloomer M.D.   On: 12/26/2016 19:48   Dg Chest Portable 1 View  Result Date: 12/26/2016 CLINICAL DATA:  Blacked diarrhea beginning last night. Hypothermia. History of bleeding stomach ulcers. EXAM: PORTABLE CHEST 1 VIEW COMPARISON:  Chest radiograph February 06, 2016 FINDINGS: Cardiomediastinal silhouette is normal. Mildly calcified aortic knob. No pleural effusions or focal consolidations. Trachea projects midline and there is no pneumothorax. Soft tissue planes and included osseous structures are non-suspicious. 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 IMPRESSION: No active disease, stable examination. Electronically Signed   By: Awilda Metroourtnay  Bloomer M.D.   On: 12/26/2016 16:56    Procedures Procedures (including critical care time)  CRITICAL CARE Performed by: Cheri FowlerKayla Lamyiah Crawshaw   Total critical care time: 45 minutes  Critical care time was exclusive of separately billable procedures and treating other patients.  Critical care was necessary to treat or prevent imminent or life-threatening deterioration.  Critical care was time spent personally by me on the following activities: development of treatment plan with patient and/or surrogate as well as nursing, discussions with consultants, evaluation of patient's response to treatment, examination of patient, obtaining history from patient or surrogate, ordering and performing treatments and interventions, ordering and review of laboratory  studies, ordering and review of radiographic studies, pulse oximetry and re-evaluation of patient's condition.   Medications Ordered in ED Medications  0.9 %  sodium chloride infusion (not administered)  pantoprazole (PROTONIX) 80 mg in sodium chloride 0.9 % 100 mL IVPB (0 mg Intravenous Stopped 12/26/16 1742)  sodium chloride 0.9 % bolus 1,000 mL (1,000 mLs Intravenous New Bag/Given 12/26/16 1802)  metroNIDAZOLE (FLAGYL) IVPB 500 mg (500 mg Intravenous New Bag/Given 12/26/16 1802)  levofloxacin (LEVAQUIN) IVPB 750 mg (750 mg Intravenous New Bag/Given 12/26/16 1803)  sodium chloride 0.9 % bolus 1,000 mL (1,000 mLs Intravenous New Bag/Given 12/26/16 1850)  sodium chloride 0.9 % bolus 500 mL (500 mLs Intravenous New Bag/Given 12/26/16 1850)  iopamidol (ISOVUE-300) 61 % injection 100 mL (100 mLs Intravenous Contrast Given 12/26/16 1916)     Initial Impression / Assessment and Plan / ED Course  I have reviewed the triage vital signs and the nursing notes.  Pertinent labs &  imaging results that were available during my care of the patient were reviewed by me and considered in my medical decision making (see chart for details).  Clinical Course    Patient with hx of UGIB 2/2 PUD and diverticulosis presents with black diarrhea and fatigue that began today.  She was hypotensive with EMS.  On arrival, she appears well, non-toxic or ill.  She is hypothermic with rectal temp of 95.7.  Vitals stable.  Abdomen soft without rebound, guarding, or rigidity.  Gross black stool on DRE.  Concern for UGIB.  Labs obtained and show hgb of 10.8.  She has a leukocytosis of 17.4.  She meets SIRS criteria for this with concern for intra-abdominal infection.  Code sepsis called.  Her lactic acid is 2.37.  IV Flagyl and Levaquin given along with IV protonix.  CT abdomen/pelvis shows colitis. Lactic and BP improved with fluids.  Spoke with GI, Dr. Nicoletta Ba will evaluate the patient in the morning, advised to call if her  conditions deteriorates.  Patient will need admission for further management. Appreciate TRH.  Case has been discussed with and seen by Dr. Erma Heritage who agrees with the above plan for admission.   Final Clinical Impressions(s) / ED Diagnoses   Final diagnoses:  Gastrointestinal hemorrhage, unspecified gastrointestinal hemorrhage type  Colitis  Sepsis, due to unspecified organism [facility])    New Prescriptions New Prescriptions   No medications on file     Cheri Fowler, Cordelia Poche 12/26/16 2041    Shaune Pollack, MD 12/28/16 [id number]

## 2016-12-26 NOTE — ED Notes (Signed)
Pt assisted onto bedpan, pt started to feel weak and had slurred speech, RN notified. Pt cleaned after bm.

## 2016-12-26 NOTE — H&P (Signed)
History and Physical  Patient Name: Marie Carpenter     ZOX:[id number]    DOB: 06-Jun-1941    DOA: 12/26/2016 PCP: Cala Bradford, MD   Patient coming from: Home via EMS  Chief Complaint: Melena, fatigue  HPI: Marie Carpenter is a 76 y.o. female with a past medical history significant for PUD followed by [facility], HTN who presents with melena and diarrhea for 1 day.  He was in her usual state of health until last night, she cooked herself a steak that had been sitting in the freezer for "too long I think", afterwards felt somewhat dizzy and "not quite right". Then this morning she woke up and started to have recurrent loose bowel movements, and she noticed these were black. As the morning progressed she noticed that she was a week, short of breath, and dizzy, so she finally called 9-1-1.  EMS found the patient too weak to stand, somewhat altered and with BP 80s systolic and brought her to the ER.  ED course: -Hypothermic 95.63F, heart rate 78-90, respirations 14-21, blood pressure 94/70 initially, pulse oximetry normal -Na 136, K 3.4, Cr 0.9, WBC 17.4K, Hgb 10.8 and normocytic, BUN 47 -FOBT+ and stool grossly black -INR and PTT normal -Lactic acid 2.37 -Troponin negative -CXR clear but CT abdomen and pelvis showed colitis -Patient transiently hypotensive in ER, given fluids and improved -She was given Levaquin/Flagyl for sepsis from colitis and PPI bolus for GI bleed and the case was discussed with Dr. Madilyn Fireman from GI who recommended treatment of colitis and GI eval in AM    In February of last year, the patient was admitted with hypotension and melena, found to have a peptic ulcer, and was discharged on PPI twice a day. At follow-up with [facility], she had re-scope that was nromal, her PPI was stopped and her H pylori serology was negative.  Since then, she has had no melena or hematochezia.  She no longer takes a PPI.  She takes no NSAIDs.  She takes no blood thinners.  Of note, a few  months ago she injured herself on a grocery cart, developed a large left abdomen lump, which is a hematoma, which is resolving slowly.      ROS: Review of Systems  Constitutional: Positive for malaise/fatigue. Negative for chills and fever.  Respiratory: Negative for cough.   Gastrointestinal: Positive for diarrhea and melena. Negative for abdominal pain, nausea and vomiting.  Genitourinary: Negative for dysuria.  Neurological: Positive for weakness.  All other systems reviewed and are negative.         Past Medical History:  Diagnosis Date  . Barrett's esophagus 02/07/2016  . Essential hypertension 02/06/2016  . Hiatal hernia 02/07/2016  . Hypertension   . Osteoporosis 02/06/2016  . PUD (peptic ulcer disease) 02/07/2016  . UGI bleed 02/06/2016    Past Surgical History:  Procedure Laterality Date  . ESOPHAGOGASTRODUODENOSCOPY Left 02/07/2016   Procedure: ESOPHAGOGASTRODUODENOSCOPY (EGD);  Surgeon: Dorena Cookey, MD;  Location: [facility];  Service: Endoscopy;  Laterality: Left;    Social History: Patient lives alone.  The patient walks unassisted.  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  She is a former smoker.    Allergies  Allergen Reactions  . Hctz [Hydrochlorothiazide] Other (See Comments)    hyponatremia  . Penicillins Other (See Comments)    Has patient had a PCN reaction causing immediate rash, facial/tongue/throat swelling,  SOB or lightheadedness with hypotension: no Has patient had a PCN reaction causing severe rash involving mucus membranes or skin necrosis: no Has patient had a PCN reaction that required hospitalization no Has patient had a PCN reaction occurring within the last 10 years: yes 5 years ago If all of the above answers are "NO", then may proceed with Cephalosporin use.    Family history: family history includes Alzheimer's disease in her mother; Heart disease in her  father.  Prior to Admission medications   Medication Sig Start Date End Date Taking? Authorizing Provider  amLODipine (NORVASC) 2.5 MG tablet Take 2.5 mg by mouth daily. 12/27/15  Yes Historical Provider, MD  cholecalciferol (VITAMIN D) 1000 units tablet Take 2,000 Units by mouth daily.    Yes Historical Provider, MD  lisinopril (PRINIVIL,ZESTRIL) 20 MG tablet Take 20 mg by mouth daily. 12/27/15  Yes Historical Provider, MD  Magnesium Gluconate (MAGNESIUM 27) 500 (27 Mg) MG TABS Take 27 mg by mouth daily.   Yes Historical Provider, MD       Physical Exam: BP 131/57   Pulse 92   Temp 97.3 F (36.3 C) (Oral)   Resp 18   SpO2 100%  General appearance: Well-developed, thin elderly adult female, alert and in no acute distress.   Eyes: Anicteric, conjunctiva pink, lids and lashes normal. PERRL.    ENT: No nasal deformity, discharge, epistaxis.  Hearing normal. OP moist without lesions.   Neck: No neck masses.  Trachea midline.  No thyromegaly/tenderness. Lymph: No cervical or supraclavicular lymphadenopathy. Skin: Warm and dry.  No jaundice.  No suspicious rashes or lesions. Cardiac: Tachycardic, regular, nl S1-S2, no murmurs appreciated.  Capillary refill is brisk.  JVP not visible.  No LE edema.  Radial pulses 2+ and symmetric. Respiratory: Normal respiratory rate and rhythm.  CTAB without rales or wheezes. Abdomen: Abdomen soft.  BS positive.  No TTP.  Large lump in left abdomen, not tender or bruised. No ascites, distension, hepatosplenomegaly.   MSK: No deformities or effusions.  No cyanosis or clubbing. Neuro: Cranial nerves grossly normal.  Sensation intact to light touch. Speech is fluent.  Muscle strength normal.    Psych: Sensorium intact and responding to questions, attention normal.  Behavior appropriate.  Affect normal.  Judgment and insight appear normal.     Labs on Admission:  I have personally reviewed following labs and imaging studies: CBC:  Recent Labs Lab  12/26/16 1621 12/26/16 1629 12/26/16 1845  WBC 17.4*  --   --   NEUTROABS 14.6*  --   --   HGB 10.8* 12.6 10.9*  HCT 33.0* 37.0 32.1*  MCV 94.6  --   --   PLT 274  --   --    Basic Metabolic Panel:  Recent Labs Lab 12/26/16 1621 12/26/16 1629  NA 136 138  K 3.4* 4.5  CL 103 100*  CO2 25  --   GLUCOSE 123* 130*  BUN 47* 60*  CREATININE 0.90 0.80  CALCIUM 8.9  --    GFR: CrCl cannot be calculated (Unknown ideal weight.).  Liver Function Tests:  Recent Labs Lab 12/26/16 1621  AST 22  ALT 18  ALKPHOS 31*  BILITOT 0.4  PROT 5.7*  ALBUMIN 3.3*   No results for input(s): LIPASE, AMYLASE in the last 168 hours. No results for input(s): AMMONIA in the last 168 hours. Coagulation Profile:  Recent Labs Lab 12/26/16 1621  INR 0.96   Cardiac Enzymes: No results for input(s): CKTOTAL, CKMB, CKMBINDEX,  TROPONINI in the last 168 hours. BNP (last 3 results) No results for input(s): PROBNP in the last 8760 hours. HbA1C: No results for input(s): HGBA1C in the last 72 hours. CBG: No results for input(s): GLUCAP in the last 168 hours. Lipid Profile: No results for input(s): CHOL, HDL, LDLCALC, TRIG, CHOLHDL, LDLDIRECT in the last 72 hours. Thyroid Function Tests: No results for input(s): TSH, T4TOTAL, FREET4, T3FREE, THYROIDAB in the last 72 hours. Anemia Panel: No results for input(s): VITAMINB12, FOLATE, FERRITIN, TIBC, IRON, RETICCTPCT in the last 72 hours. Sepsis Labs: Lactic acid 2.3 --> 1.2 Invalid input(s): PROCALCITONIN, LACTICIDVEN No results found for this or any previous visit (from the past 240 hour(s)).       Radiological Exams on Admission: Personally reviewed CXR shows no focal opacity or pneumonia.  CT abdomen report reviewed and shows colitis, no diverticulitis, and known hematoma: Ct Abdomen Pelvis W Contrast  Result Date: 12/26/2016 CLINICAL DATA:  Bloody stools and dizziness today. History of diverticulosis. EXAM: CT ABDOMEN AND PELVIS WITH  CONTRAST TECHNIQUE: Multidetector CT imaging of the abdomen and pelvis was performed using the standard protocol following bolus administration of intravenous contrast. CONTRAST:  ISOVUE-300 IOPAMIDOL (ISOVUE-300) INJECTION 61% COMPARISON:  CT abdomen and pelvis Apr 25, 2008 FINDINGS: LOWER CHEST: Lung bases are clear. Included heart size is normal. No pericardial effusion. Small LEFT fat containing diaphragmatic hernia. HEPATOBILIARY: Status post cholecystectomy.  Liver is normal. PANCREAS: Normal. SPLEEN: Normal. ADRENALS/URINARY TRACT: Kidneys are orthotopic, demonstrating symmetric enhancement. No nephrolithiasis, hydronephrosis or solid renal masses. Too small to characterize stable LEFT interpolar hypodensity. The unopacified ureters are normal in course and caliber. Delayed imaging through the kidneys demonstrates symmetric prompt contrast excretion within the proximal urinary collecting system. Urinary bladder is well distended and unremarkable. Normal adrenal glands. STOMACH/BOWEL: Small hiatal hernia. Evaluation for bowel pathology decreased without enteric contrast. Contiguous distal descending and rectosigmoid wall thickening and mucosal enhancement. Moderate sigmoid diverticulosis. Colonic and small bowel air-fluid levels. Proximal due weight Tinel diverticulum. In VASCULAR/LYMPHATIC: Aortoiliac vessels are normal in course and caliber, mild calcific atherosclerosis. No lymphadenopathy by CT size criteria. REPRODUCTIVE: Normal. OTHER: No intraperitoneal free fluid or free air. MUSCULOSKELETAL: Nonacute. Mild degenerative change of the hips. LEFT lateral abdominal wall subcutaneous 2.3 x 3.9 cm globular low-density mass. Minimal grade 1 L4-5 anterolisthesis on degenerative basis. IMPRESSION: LEFT colitis. Moderate sigmoid diverticulosis without acute diverticulitis. LEFT lateral abdominal wall probable degenerating hematoma. Electronically Signed   By: Awilda Metro M.D.   On: 12/26/2016 19:48    Dg Chest Portable 1 View  Result Date: 12/26/2016 CLINICAL DATA:  Blacked diarrhea beginning last night. Hypothermia. History of bleeding stomach ulcers. EXAM: PORTABLE CHEST 1 VIEW COMPARISON:  Chest radiograph February 06, 2016 FINDINGS: Cardiomediastinal silhouette is normal. Mildly calcified aortic knob. No pleural effusions or focal consolidations. Trachea projects midline and there is no pneumothorax. Soft tissue planes and included osseous structures are non-suspicious. 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 IMPRESSION: No active disease, stable examination. Electronically Signed   By: Awilda Metro M.D.   On: 12/26/2016 16:56    EKG: Independently reviewed. Rate 77, QTc 377, lateral TW flattening, nonsepcific, no ST changes.   EGD Feb 2017: ENDOSCOPIC IMPRESSION 2 gastric ulcers without stigmata of hemorrhage currently Barrett's esophagus hiatal hernia    Assessment/Plan  1. Upper GI bleed:  Previous UGIB, elevated BUN.  No NSAIDs or blood thinners.   -Agree with monitoring in stepdown -NPO -MIVF -PPI bolus dosing -GI consulted, appreciate cards -CBC q8hrs  overnight  2. Acute blood loss anemia:  -CBC q8hrs overnight and transfuse for Hgb <7 or further instability  3. Sepsis syndrome:  Suspected source colitis (from food poisoning?). Organism unknown.   Patient meets criteria given tachycardia, hypothyermia, leukocytosis, and evidence of organ dysfunction.  Lactate 2.3 mmol/L and repeat completed.   -Sepsis bundle utilized:  -Blood and urine cultures drawn  -30 ml/kg bolus given in ED given SBP < 90  -Antibiotics: Levaquin and Flagyl   -Repeat renal function and complete blood count in AM  -Code SEPSIS called to E-link  -Obtain stool for GI pathogen PCR   4. Hypertension:  Hypotensive at admission -Hold antihypertensives  5. Hypokalemia:  -Check magnesium -Supplement orally       DVT prophylaxis: SCDs  Code Status: FULL  Family  Communication: None present  Disposition Plan: Anticipate IV fluids and close monitoring of hemodynamic status in Stepdown.  GI consult tomorrow.  Treatment of sepsis syndrome with IV antibiotics. Consults called: [facility], [facility] Admission status: INPATIENT, stepdown      Medical decision making: Patient seen at 8:30 PM on 12/26/2016.  The patient was discussed with Cheri Fowler and Dr. Sheria Lang.  What exists of the patient's chart was reviewed in depth and summarized above.  Clinical condition: BP improved with fluids, heart rate still tachycardic but mentating well, will monitor in stepdown.        Alberteen Sam [facility] Pager [phone]      At the time of admission, it appears that the appropriate admission status for this patient is INPATIENT. This is judged to be reasonable and necessary in order to provide the required intensity of service to ensure the patient's safety given the presenting symptoms, physical exam findings, and initial radiographic and laboratory data in the context of their chronic comorbidities.  Together, these circumstances are felt to place her at high risk for further clinical deterioration threatening life, limb, or organ.   Patient requires inpatient status due to high intensity of service, high risk for further deterioration and high frequency of surveillance required because of this acute illness that poses a threat to life, limb or bodily function.  Factors supporting inpatient status: Source of infection in setting of sepsis physiology and elevated lactic acid Upper GI bleed, probably ongoing, with hypotension and tachycardia  I certify that at the point of admission it is my clinical judgment that the patient will require inpatient hospital care spanning beyond 2 midnights from the point of admission and that early discharge would result in unnecessary risk of decompensation and readmission or threat to life, limb or bodily function.

## 2016-12-26 NOTE — ED Triage Notes (Signed)
Pt arrives from home via [facility]. Pt states she began having diarrhea last night that is black. EMS states when they arrived patient had no palpable radial pulses and had a BP of 80 systolic. Pt had a second BM and then began to become more alert and returned to baseline. Pt also has c/o neck and shoulder pain.

## 2016-12-26 NOTE — Progress Notes (Signed)
Pharmacy Antibiotic Note  Marie Carpenter is a 76 y.o. female admitted on 12/26/2016 with intra-abdominal infection.  Pharmacy has been consulted for flagyl and levaquin dosing.  Plan: Flagyl 500mg  IV q8h Levaquin 750mg  IV q24h Monitor culture data, renal function and clinical course     Temp (24hrs), Avg:95.7 F (35.4 C), Min:95.7 F (35.4 C), Max:95.7 F (35.4 C)   Recent Labs Lab 12/26/16 1621 12/26/16 1629  WBC 17.4*  --   CREATININE  --  0.80  LATICACIDVEN  --  2.37*    CrCl cannot be calculated (Unknown ideal weight.).    Allergies  Allergen Reactions  . Hctz [Hydrochlorothiazide] Other (See Comments)    hyponatremia  . Penicillins Other (See Comments)    Has patient had a PCN reaction causing immediate rash, facial/tongue/throat swelling, SOB or lightheadedness with hypotension: no Has patient had a PCN reaction causing severe rash involving mucus membranes or skin necrosis: no Has patient had a PCN reaction that required hospitalization no Has patient had a PCN reaction occurring within the last 10 years: yes 5 years ago If all of the above answers are "NO", then may proceed with Cephalosporin use.     Arlean Hoppingorey M. Newman PiesBall, PharmD, BCPS Clinical Pharmacist Pager [phone] 12/26/2016 5:37 PM

## 2016-12-26 NOTE — ED Notes (Signed)
Pt placed on 2L [place] rt frequent desats.

## 2016-12-27 ENCOUNTER — Inpatient Hospital Stay (HOSPITAL_COMMUNITY): Payer: Medicare Other | Admitting: Anesthesiology

## 2016-12-27 ENCOUNTER — Encounter (HOSPITAL_COMMUNITY): Payer: Self-pay | Admitting: *Deleted

## 2016-12-27 ENCOUNTER — Encounter (HOSPITAL_COMMUNITY)
Admission: EM | Disposition: A | Discharge status: Home / Self Care | Payer: Self-pay | Source: Home / Self Care | Attending: Internal Medicine

## 2016-12-27 DIAGNOSIS — K922 Gastrointestinal hemorrhage, unspecified: Secondary | ICD-10-CM

## 2016-12-27 DIAGNOSIS — A419 Sepsis, unspecified organism: Principal | ICD-10-CM

## 2016-12-27 DIAGNOSIS — D62 Acute posthemorrhagic anemia: Secondary | ICD-10-CM

## 2016-12-27 DIAGNOSIS — K529 Noninfective gastroenteritis and colitis, unspecified: Secondary | ICD-10-CM

## 2016-12-27 DIAGNOSIS — I10 Essential (primary) hypertension: Secondary | ICD-10-CM

## 2016-12-27 HISTORY — PX: ESOPHAGOGASTRODUODENOSCOPY: SHX5428

## 2016-12-27 LAB — URINALYSIS, ROUTINE W REFLEX MICROSCOPIC
Bacteria, UA: NONE SEEN
Bilirubin Urine: NEGATIVE
Glucose, UA: NEGATIVE mg/dL
Ketones, ur: NEGATIVE mg/dL
Nitrite: NEGATIVE
Protein, ur: NEGATIVE mg/dL
SPECIFIC GRAVITY, URINE: 1.032 — AB (ref 1.005–1.030)
pH: 5 (ref 5.0–8.0)

## 2016-12-27 LAB — BASIC METABOLIC PANEL
Anion gap: 4 — ABNORMAL LOW (ref 5–15)
BUN: 17 mg/dL (ref 6–20)
CHLORIDE: 113 mmol/L — AB (ref 101–111)
CO2: 23 mmol/L (ref 22–32)
CREATININE: 0.54 mg/dL (ref 0.44–1.00)
Calcium: 8.1 mg/dL — ABNORMAL LOW (ref 8.9–10.3)
GFR calc Af Amer: >60 mL/min (ref >60)
GFR calc non Af Amer: >60 mL/min (ref >60)
Glucose, Bld: 105 mg/dL — ABNORMAL HIGH (ref 65–99)
Potassium: 4.2 mmol/L (ref 3.5–5.1)
SODIUM: 140 mmol/L (ref 135–145)

## 2016-12-27 LAB — CBC
HCT: 29.2 % — ABNORMAL LOW (ref 36.0–46.0)
HEMATOCRIT: 27.8 % — AB (ref 36.0–46.0)
HEMOGLOBIN: 9.4 g/dL — AB (ref 12.0–15.0)
HEMOGLOBIN: 9.6 g/dL — AB (ref 12.0–15.0)
MCH: 32.2 pg (ref 26.0–34.0)
MCH: 31.4 pg (ref 26.0–34.0)
MCHC: 33.8 g/dL (ref 30.0–36.0)
MCHC: 32.9 g/dL (ref 30.0–36.0)
MCV: 95.2 fL (ref 78.0–100.0)
MCV: 95.4 fL (ref 78.0–100.0)
PLATELETS: 221 10*3/uL (ref 150–400)
Platelets: 227 10*3/uL (ref 150–400)
RBC: 2.92 MIL/uL — AB (ref 3.87–5.11)
RBC: 3.06 MIL/uL — ABNORMAL LOW (ref 3.87–5.11)
RDW: 13.9 % (ref 11.5–15.5)
RDW: 14 % (ref 11.5–15.5)
WBC: 13.6 10*3/uL — ABNORMAL HIGH (ref 4.0–10.5)
WBC: 11.6 10*3/uL — ABNORMAL HIGH (ref 4.0–10.5)

## 2016-12-27 LAB — GASTROINTESTINAL PANEL BY PCR, STOOL (REPLACES STOOL CULTURE)

## 2016-12-27 LAB — MRSA PCR SCREENING: MRSA by PCR: NEGATIVE

## 2016-12-27 LAB — MAGNESIUM: MAGNESIUM: 1.8 mg/dL (ref 1.7–2.4)

## 2016-12-27 SURGERY — EGD (ESOPHAGOGASTRODUODENOSCOPY)
Anesthesia: Monitor Anesthesia Care

## 2016-12-27 MED ORDER — PROPOFOL 10 MG/ML IV BOLUS
INTRAVENOUS | Status: DC | PRN
  Administered 2016-12-27 (×2): 30 mg via INTRAVENOUS

## 2016-12-27 MED ORDER — SODIUM CHLORIDE 0.9 % IV SOLN: INTRAVENOUS | Status: DC

## 2016-12-27 MED ORDER — LACTATED RINGERS IV SOLN
INTRAVENOUS | Status: DC | PRN
  Administered 2016-12-27: 12:00:00 via INTRAVENOUS

## 2016-12-27 MED ORDER — LORAZEPAM 0.5 MG PO TABS
0.5000 mg | ORAL_TABLET | Freq: Once | ORAL | Status: AC
  Administered 2016-12-27: 0.5 mg via ORAL
  Filled 2016-12-27: qty 1

## 2016-12-27 MED ORDER — PROPOFOL 500 MG/50ML IV EMUL
INTRAVENOUS | Status: DC | PRN
  Administered 2016-12-27: 50 ug/kg/min via INTRAVENOUS

## 2016-12-27 MED ORDER — ONDANSETRON HCL 4 MG/2ML IJ SOLN
INTRAMUSCULAR | Status: DC | PRN
  Administered 2016-12-27: 4 mg via INTRAVENOUS

## 2016-12-27 MED ORDER — MIDAZOLAM HCL 5 MG/5ML IJ SOLN
INTRAMUSCULAR | Status: DC | PRN
  Administered 2016-12-27: 2 mg via INTRAVENOUS

## 2016-12-27 MED ORDER — LIDOCAINE HCL (CARDIAC) 20 MG/ML IV SOLN
INTRAVENOUS | Status: DC | PRN
  Administered 2016-12-27: 50 mg via INTRATRACHEAL

## 2016-12-27 NOTE — Progress Notes (Signed)
PROGRESS NOTE    Marie LootsSara L Carpenter  AVW:[id number] DOB: Jun 25, 1941 DOA: 12/26/2016  PCP: Cala BradfordWHITE,CYNTHIA S, MD   Brief Narrative:  Marie LootsSara L Carpenter is a [age] y.o. female with a past medical history significant for PUD [facility], HTN who presents with melena and diarrhea for 1 day. History of gastric ulcer last year treated for 3 mo with PPI.   Subjective: Continues to have black stools. No abdominal pain but states she has been having frequent heart burn for 1 month.   Assessment & Plan:   Principal Problem:   Upper GI bleed - s/p EGD today- has gastric ulcer and Barrett's Esophagus -cont PPI- advance diet per GI  Active Problems:   Essential hypertension   Acute blood loss anemia - follow Hb and transfuse if < 8    Sepsis, unspecified organism  - leukocytosis, temp of 95/7 on admission - possible colitis on CT but no abdominal tenderness on exam- on Levaquin and Flagyl- follow  Dehydration - cont IVF  HTN - hold Norvasc and Lisinopril as BP low normal    DVT prophylaxis: SCDs Code Status: Full code Family Communication:   Disposition Plan: home when stable Consultants:   GI Procedures:   EGD today Antimicrobials:  Anti-infectives    Start     Dose/Rate Route Frequency Ordered Stop   12/27/16 1800  levofloxacin (LEVAQUIN) IVPB 750 mg     750 mg 100 mL/hr over 90 Minutes Intravenous Every 24 hours 12/26/16 2051     12/27/16 0200  metroNIDAZOLE (FLAGYL) IVPB 500 mg     500 mg 100 mL/hr over 60 Minutes Intravenous Every 8 hours 12/26/16 2051     12/26/16 1800  metroNIDAZOLE (FLAGYL) IVPB 500 mg     500 mg 100 mL/hr over 60 Minutes Intravenous  Once 12/26/16 1738 12/26/16 1902   12/26/16 1800  levofloxacin (LEVAQUIN) IVPB 750 mg     750 mg 100 mL/hr over 90 Minutes Intravenous  Once 12/26/16 1738 12/26/16 2114       Objective: Vitals:   12/27/16 0825 12/27/16 1131 12/27/16 1220 12/27/16 1225  BP: 122/64 (!) 138/43 (!) 98/37 (!) 114/52  Pulse: 94  79 85 85  Resp: 20 13 15 14   Temp: 98.3 F (36.8 C) 98.1 F (36.7 C) 98.8 F (37.1 C)   TempSrc: Oral Oral Oral   SpO2: 97% 100% 99% 98%  Weight:      Height:        Intake/Output Summary (Last 24 hours) at 12/27/16 1309 Last data filed at 12/27/16 1200  Gross per 24 hour  Intake          3868.75 ml  Output              600 ml  Net          3268.75 ml   Filed Weights   12/26/16 2258  Weight: 74.5 kg (164 lb 3.9 oz)    Examination: General exam: Appears comfortable  HEENT: PERRLA, oral mucosa moist, no sclera icterus or thrush Respiratory system: Clear to auscultation. Respiratory effort normal. Cardiovascular system: S1 & S2 heard, RRR.  No murmurs  Gastrointestinal system: Abdomen soft, non-tender, nondistended. Normal bowel sound. No organomegaly Central nervous system: Alert and oriented. No focal neurological deficits. Extremities: No cyanosis, clubbing or edema Skin: No rashes or ulcers Psychiatry:  Mood & affect appropriate.     Data Reviewed: I have personally reviewed following labs and imaging studies  CBC:  Recent Labs  Lab 12/26/16 1621 12/26/16 1629 12/26/16 1845 12/26/16 2331 12/27/16 0957  WBC 17.4*  --   --  13.7* 13.6*  NEUTROABS 14.6*  --   --   --   --   HGB 10.8* 12.6 10.9* 9.5* 9.4*  HCT 33.0* 37.0 32.1* 28.6* 27.8*  MCV 94.6  --   --  94.7 95.2  PLT 274  --   --  239 227   Basic Metabolic Panel:  Recent Labs Lab 12/26/16 1621 12/26/16 1629 12/26/16 2331 12/27/16 0957  NA 136 138  --  140  K 3.4* 4.5  --  4.2  CL 103 100*  --  113*  CO2 25  --   --  23  GLUCOSE 123* 130*  --  105*  BUN 47* 60*  --  17  CREATININE 0.90 0.80  --  0.54  CALCIUM 8.9  --   --  8.1*  MG  --   --  1.8  --    GFR: Estimated Creatinine Clearance: 58.7 mL/min (by C-G formula based on SCr of 0.54 mg/dL). Liver Function Tests:  Recent Labs Lab 12/26/16 1621  AST 22  ALT 18  ALKPHOS 31*  BILITOT 0.4  PROT 5.7*  ALBUMIN 3.3*   No results for  input(s): LIPASE, AMYLASE in the last 168 hours. No results for input(s): AMMONIA in the last 168 hours. Coagulation Profile:  Recent Labs Lab 12/26/16 1621  INR 0.96   Cardiac Enzymes: No results for input(s): CKTOTAL, CKMB, CKMBINDEX, TROPONINI in the last 168 hours. BNP (last 3 results) No results for input(s): PROBNP in the last 8760 hours. HbA1C: No results for input(s): HGBA1C in the last 72 hours. CBG: No results for input(s): GLUCAP in the last 168 hours. Lipid Profile: No results for input(s): CHOL, HDL, LDLCALC, TRIG, CHOLHDL, LDLDIRECT in the last 72 hours. Thyroid Function Tests: No results for input(s): TSH, T4TOTAL, FREET4, T3FREE, THYROIDAB in the last 72 hours. Anemia Panel: No results for input(s): VITAMINB12, FOLATE, FERRITIN, TIBC, IRON, RETICCTPCT in the last 72 hours. Urine analysis:    Component Value Date/Time   COLORURINE YELLOW 12/27/2016 0054   APPEARANCEUR CLEAR 12/27/2016 0054   LABSPEC 1.032 (H) 12/27/2016 0054   PHURINE 5.0 12/27/2016 0054   GLUCOSEU NEGATIVE 12/27/2016 0054   HGBUR MODERATE (A) 12/27/2016 0054   BILIRUBINUR NEGATIVE 12/27/2016 0054   KETONESUR NEGATIVE 12/27/2016 0054   PROTEINUR NEGATIVE 12/27/2016 0054   NITRITE NEGATIVE 12/27/2016 0054   LEUKOCYTESUR SMALL (A) 12/27/2016 0054   Sepsis Labs: @LABRCNTIP (procalcitonin:4,lacticidven:4) ) Recent Results (from the past 240 hour(s))  MRSA PCR Screening     Status: None   Collection Time: 12/26/16 10:54 PM  Result Value Ref Range Status   MRSA by PCR NEGATIVE NEGATIVE Final    Comment:        The GeneXpert MRSA Assay (FDA approved for NASAL specimens only), is one component of a comprehensive MRSA colonization surveillance program. It is not intended to diagnose MRSA infection nor to guide or monitor treatment for MRSA infections.          Radiology Studies: Ct Abdomen Pelvis W Contrast  Addendum Date: 12/26/2016   ADDENDUM REPORT: 12/26/2016 21:17 ADDENDUM:  Correction:  Findings, stomach/bowel: Proximal duodenum diverticulum. Electronically Signed   By: Awilda Metro M.D.   On: 12/26/2016 21:17   Result Date: 12/26/2016 CLINICAL DATA:  Bloody stools and dizziness today. History of diverticulosis. EXAM: CT ABDOMEN AND PELVIS WITH CONTRAST TECHNIQUE: Multidetector CT imaging of the  abdomen and pelvis was performed using the standard protocol following bolus administration of intravenous contrast. CONTRAST:  ISOVUE-300 IOPAMIDOL (ISOVUE-300) INJECTION 61% COMPARISON:  CT abdomen and pelvis Apr 25, 2008 FINDINGS: LOWER CHEST: Lung bases are clear. Included heart size is normal. No pericardial effusion. Small LEFT fat containing diaphragmatic hernia. HEPATOBILIARY: Status post cholecystectomy.  Liver is normal. PANCREAS: Normal. SPLEEN: Normal. ADRENALS/URINARY TRACT: Kidneys are orthotopic, demonstrating symmetric enhancement. No nephrolithiasis, hydronephrosis or solid renal masses. Too small to characterize stable LEFT interpolar hypodensity. The unopacified ureters are normal in course and caliber. Delayed imaging through the kidneys demonstrates symmetric prompt contrast excretion within the proximal urinary collecting system. Urinary bladder is well distended and unremarkable. Normal adrenal glands. STOMACH/BOWEL: Small hiatal hernia. Evaluation for bowel pathology decreased without enteric contrast. Contiguous distal descending and rectosigmoid wall thickening and mucosal enhancement. Moderate sigmoid diverticulosis. Colonic and small bowel air-fluid levels. Proximal due weight Tinel diverticulum. In VASCULAR/LYMPHATIC: Aortoiliac vessels are normal in course and caliber, mild calcific atherosclerosis. No lymphadenopathy by CT size criteria. REPRODUCTIVE: Normal. OTHER: No intraperitoneal free fluid or free air. MUSCULOSKELETAL: Nonacute. Mild degenerative change of the hips. LEFT lateral abdominal wall subcutaneous 2.3 x 3.9 cm globular low-density mass.  Minimal grade 1 L4-5 anterolisthesis on degenerative basis. IMPRESSION: LEFT colitis. Moderate sigmoid diverticulosis without acute diverticulitis. LEFT lateral abdominal wall probable degenerating hematoma. Electronically Signed: By: Awilda Metro M.D. On: 12/26/2016 19:48   Dg Chest Portable 1 View  Result Date: 12/26/2016 CLINICAL DATA:  Blacked diarrhea beginning last night. Hypothermia. History of bleeding stomach ulcers. EXAM: PORTABLE CHEST 1 VIEW COMPARISON:  Chest radiograph February 06, 2016 FINDINGS: Cardiomediastinal silhouette is normal. Mildly calcified aortic knob. No pleural effusions or focal consolidations. Trachea projects midline and there is no pneumothorax. Soft tissue planes and included osseous structures are non-suspicious. 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 IMPRESSION: No active disease, stable examination. Electronically Signed   By: Awilda Metro M.D.   On: 12/26/2016 16:56      Scheduled Meds: . levofloxacin (LEVAQUIN) IV  750 mg Intravenous Q24H  . metronidazole  500 mg Intravenous Q8H  . pantoprazole  40 mg Intravenous Q12H   Continuous Infusions: . sodium chloride 125 mL/hr at 12/27/16 0525     LOS: 1 day    Time spent in minutes: 35    Navin Dogan, MD [facility] Pager: [email or web address] Password [email or web address] 12/27/2016, 1:09 PM

## 2016-12-27 NOTE — Anesthesia Procedure Notes (Signed)
Procedure Name: MAC Date/Time: 12/27/2016 11:57 AM Performed by: Jacquiline Doe A Pre-anesthesia Checklist: Patient identified, Emergency Drugs available, Suction available and Patient being monitored Patient Re-evaluated:Patient Re-evaluated prior to inductionOxygen Delivery Method: Nasal cannula Intubation Type: IV induction Placement Confirmation: positive ETCO2 Dental Injury: Teeth and Oropharynx as per pre-operative assessment

## 2016-12-27 NOTE — Transfer of Care (Signed)
Immediate Anesthesia Transfer of Care Note  Patient: Marie Carpenter  Procedure(s) Performed: Procedure(s): ESOPHAGOGASTRODUODENOSCOPY (EGD) (N/A)  Patient Location: Endoscopy Unit  Anesthesia Type:MAC  Level of Consciousness: awake, oriented, sedated, patient cooperative and responds to stimulation  Airway & Oxygen Therapy: Patient Spontanous Breathing and Patient connected to nasal cannula oxygen  Post-op Assessment: Report given to RN, Post -op Vital signs reviewed and stable, Patient moving all extremities and Patient moving all extremities X 4  Post vital signs: Reviewed and stable  Last Vitals:  Vitals:   12/27/16 1131 12/27/16 1220  BP: (!) 138/43 (!) 98/37  Pulse: 79 85  Resp: 13 15  Temp: 36.7 C 37.1 C    Last Pain:  Vitals:   12/27/16 1220  TempSrc: Oral  PainSc:          Complications: No apparent anesthesia complications

## 2016-12-27 NOTE — Op Note (Signed)
[facility] Patient Name: Marie Carpenter Procedure Date : 12/27/2016 MRN: [id number] Attending MD: Graylin Shiver , MD Date of Birth: 1941-04-21 CSN: [id number] Age: 76 Admit Type: Inpatient Procedure:                Upper GI endoscopy Indications:              Melena Providers:                Graylin Shiver, MD, Norman Clay, RN, [facility]                            Tech, Technician, Wray Kearns, CRNA Referring MD:              Medicines:                Propofol per Anesthesia Complications:            No immediate complications. Estimated Blood Loss:     Estimated blood loss: none. Procedure:                Pre-Anesthesia Assessment:                           - Prior to the procedure, a History and Physical                            was performed, and patient medications and                            allergies were reviewed. The patient's tolerance of                            previous anesthesia was also reviewed. The risks                            and benefits of the procedure and the sedation                            options and risks were discussed with the patient.                            All questions were answered, and informed consent                            was obtained. Prior Anticoagulants: The patient has                            taken no previous anticoagulant or antiplatelet                            agents. ASA Grade Assessment: II - A patient with                            mild systemic disease. After reviewing the risks  and benefits, the patient was deemed in                            satisfactory condition to undergo the procedure.                           After obtaining informed consent, the endoscope was                            passed under direct vision. Throughout the                            procedure, the patient's blood pressure, pulse, and                            oxygen saturations were  monitored continuously. The                            [id number] ([id number]) scope was introduced through the                            mouth, and advanced to the second part of duodenum.                            The upper GI endoscopy was accomplished without                            difficulty. The patient tolerated the procedure                            well. Scope In: Scope Out: Findings:      There were esophageal mucosal changes secondary to established       short-segment Barrett's disease present in the lower third of the       esophagus. Biopsies not done as they were done last year by Dr Madilyn Fireman.      One cratered gastric ulcer with no stigmata of bleeding was found in the       gastric antrum. The lesion was 5 mm in largest dimension. Biopsies were       taken with a cold forceps for histology.      The examined duodenum was normal. Impression:               - Esophageal mucosal changes secondary to                            established short-segment Barrett's disease.                           - Gastric ulcer with no stigmata of bleeding.                            Biopsied.                           - Normal examined duodenum. Moderate Sedation:      .  Recommendation:           - Full liquid diet.                           - Continue present medications.                           - Await pathology results. Procedure Code(s):        --- Professional ---                           [phone], Esophagogastroduodenoscopy, flexible,                            transoral; with biopsy, single or multiple Diagnosis Code(s):        --- Professional ---                           K22.70, Barrett's esophagus without dysplasia                           K25.9, Gastric ulcer, unspecified as acute or                            chronic, without hemorrhage or perforation                           K92.1, Melena (includes Hematochezia) CPT copyright 2016 American Medical Association. All  rights reserved. The codes documented in this report are preliminary and upon coder review may  be revised to meet current compliance requirements. Graylin ShiverSalem F Andreana Klingerman, MD 12/27/2016 12:19:13 PM This report has been signed electronically. Number of Addenda: 0

## 2016-12-27 NOTE — Consult Note (Signed)
Subjective:   HPI  The patient is a 76 year old female who was admitted to the hospital with complaints of melena and fatigue. She states that she was feeling fine until 2 days ago when she ate a steak that had been in the freezer too  long she thought and afterwards started feeling weak and dizzy. she then started having diarrhea, and noted that it was black in color. She does have a history of peptic ulcer disease. She also has a history of Barrett's esophagus. She underwent a couple of endoscopies last year by Dr. Madilyn Fireman. She also states she had a colonoscopy last year by Dr. Madilyn Fireman and has diverticulosis. She also had a CT scan when she came into the hospital which showed what looks like colitis in the rectosigmoid and descending colon. She has been having diarrhea. Stool has been collected for enteric pathogens. She was feeling fine prior to all of this. Of note is that she had not been on any PPIs recently.  Review of Systems No chest pain or shortness of breath  Past Medical History:  Diagnosis Date  . Barrett's esophagus 02/07/2016  . Essential hypertension 02/06/2016  . Hiatal hernia 02/07/2016  . Hypertension   . Osteoporosis 02/06/2016  . PUD (peptic ulcer disease) 02/07/2016  . UGI bleed 02/06/2016   Past Surgical History:  Procedure Laterality Date  . ESOPHAGOGASTRODUODENOSCOPY Left 02/07/2016   Procedure: ESOPHAGOGASTRODUODENOSCOPY (EGD);  Surgeon: Dorena Cookey, MD;  Location: [facility];  Service: Endoscopy;  Laterality: Left;   Social History   Social History  . Marital status: Married    Spouse name: N/A  . Number of children: N/A  . Years of education: N/A   Occupational History  . Not on file.   Social History Main Topics  . Smoking status: Former Games developer  . Smokeless tobacco: Never Used  . Alcohol use No  . Drug use: No  . Sexual activity: Not on file   Other Topics Concern  . Not on file   Social History Narrative  . No narrative on file   family history  includes Alzheimer's disease in her mother; Heart disease in her father.  Current Facility-Administered Medications:  .  0.9 %  sodium chloride infusion, , Intravenous, Continuous, Alberteen Sam, MD, Last Rate: 125 mL/hr at 12/27/16 0525 .  acetaminophen (TYLENOL) tablet 650 mg, 650 mg, Oral, Q6H PRN **OR** acetaminophen (TYLENOL) suppository 650 mg, 650 mg, Rectal, Q6H PRN, Alberteen Sam, MD .  levofloxacin (LEVAQUIN) IVPB 750 mg, 750 mg, Intravenous, Q24H, Marquita Palms, RPH .  metroNIDAZOLE (FLAGYL) IVPB 500 mg, 500 mg, Intravenous, Q8H, Marquita Palms, [facility], Last Rate: 100 mL/hr at 12/27/16 0213, 500 mg at 12/27/16 1478 .  ondansetron (ZOFRAN) tablet 4 mg, 4 mg, Oral, Q6H PRN **OR** ondansetron (ZOFRAN) injection 4 mg, 4 mg, Intravenous, Q6H PRN, Alberteen Sam, MD .  pantoprazole (PROTONIX) injection 40 mg, 40 mg, Intravenous, Q12H, Alberteen Sam, MD Allergies  Allergen Reactions  . Hctz [Hydrochlorothiazide] Other (See Comments)    hyponatremia  . Penicillins Other (See Comments)    Has patient had a PCN reaction causing immediate rash, facial/tongue/throat swelling, SOB or lightheadedness with hypotension: no Has patient had a PCN reaction causing severe rash involving mucus membranes or skin necrosis: no Has patient had a PCN reaction that required hospitalization no Has patient had a PCN reaction occurring within the last 10 years: yes 5 years ago If all of the above answers are "NO", then may  proceed with Cephalosporin use.     Objective:     BP 122/64   Pulse 94   Temp 98.3 F (36.8 C) (Oral)   Resp 20   Ht 5\' 3"  (1.6 m)   Wt 74.5 kg (164 lb 3.9 oz)   SpO2 97%   BMI 29.09 kg/m   She is in no distress  Nonicteric  Heart regular rhythm no murmurs  Lungs clear  Abdomen: Bowel sounds present, soft, slight discomfort in the lower abdomen but no rebound or guarding  Laboratory No components found for: D1    Assessment:     #1.  Melena  #2. History of peptic ulcer disease  #3. History of Barrett's esophagus patient had endoscopy last year  #4. Diarrhea with signs of colitis on CT scan in the descending and rectosigmoid region of the colon.      Plan:     Continue supportive care. PPI therapy. We will plan endoscopy later today. In regards to the findings of colitis and diarrhea she is on enteric pathogen precaution and stools are being checked for enteric pathogens. Lab Results  Component Value Date   HGB 9.5 (L) 12/26/2016   HGB 10.9 (L) 12/26/2016   HGB 12.6 12/26/2016   HCT 28.6 (L) 12/26/2016   HCT 32.1 (L) 12/26/2016   HCT 37.0 12/26/2016   ALKPHOS 31 (L) 12/26/2016   ALKPHOS 16 (L) 02/07/2016   ALKPHOS 23 (L) 02/06/2016   AST 22 12/26/2016   AST 18 02/07/2016   AST 20 02/06/2016   ALT 18 12/26/2016   ALT 13 (L) 02/07/2016   ALT 17 02/06/2016

## 2016-12-27 NOTE — Anesthesia Preprocedure Evaluation (Addendum)
Anesthesia Evaluation  Patient identified by MRN, date of birth, ID band Patient awake    Reviewed: Allergy & Precautions, NPO status , Patient's Chart, lab work & pertinent test results  Airway Mallampati: II  TM Distance: >3 FB     Dental   Pulmonary former smoker,    breath sounds clear to auscultation       Cardiovascular hypertension, Pt. on medications  Rhythm:Regular Rate:Normal     Neuro/Psych negative neurological ROS     GI/Hepatic Neg liver ROS, hiatal hernia, PUD,   Endo/Other  negative endocrine ROS  Renal/GU negative Renal ROS     Musculoskeletal   Abdominal   Peds  Hematology  (+) anemia ,   Anesthesia Other Findings   Reproductive/Obstetrics                            Anesthesia Physical Anesthesia Plan  ASA: II  Anesthesia Plan: MAC   Post-op Pain Management:    Induction: Intravenous  Airway Management Planned: Natural Airway and Nasal Cannula  Additional Equipment:   Intra-op Plan:   Post-operative Plan:   Informed Consent: I have reviewed the patients History and Physical, chart, labs and discussed the procedure including the risks, benefits and alternatives for the proposed anesthesia with the patient or authorized representative who has indicated his/her understanding and acceptance.     Plan Discussed with:   Anesthesia Plan Comments:         Anesthesia Quick Evaluation

## 2016-12-28 ENCOUNTER — Encounter (HOSPITAL_COMMUNITY): Payer: Self-pay | Admitting: Gastroenterology

## 2016-12-28 DIAGNOSIS — K22719 Barrett's esophagus with dysplasia, unspecified: Secondary | ICD-10-CM

## 2016-12-28 LAB — C DIFFICILE QUICK SCREEN W PCR REFLEX
C DIFFICILE (CDIFF) TOXIN: NEGATIVE
C Diff antigen: NEGATIVE
C Diff interpretation: NOT DETECTED

## 2016-12-28 LAB — URINE CULTURE: CULTURE: NO GROWTH

## 2016-12-28 MED ORDER — LEVOFLOXACIN 750 MG PO TABS: 750.0000 mg | ORAL_TABLET | Freq: Every day | ORAL | 0 refills | Status: DC

## 2016-12-28 MED ORDER — PANTOPRAZOLE SODIUM 40 MG PO TBEC: 40.0000 mg | DELAYED_RELEASE_TABLET | Freq: Two times a day (BID) | ORAL | 3 refills | Status: AC

## 2016-12-28 MED ORDER — METRONIDAZOLE 500 MG PO TABS: 500.0000 mg | ORAL_TABLET | Freq: Three times a day (TID) | ORAL | 0 refills | Status: DC

## 2016-12-28 NOTE — Discharge Summary (Signed)
Physician Discharge Summary  Marie Carpenter:[id number] DOB: Apr 08, 1941 DOA: 12/26/2016  PCP: Cala Bradford, MD  Admit date: 12/26/2016 Discharge date: 12/28/2016  Admitted From:home Disposition:  home   Recommendations for Outpatient Follow-up:  1. F/u on pathology report  Home Health:  none  Equipment/Devices:  none    Discharge Condition:  stable   CODE STATUS:  Full code   Diet recommendation:  Bland, low sodium, heart healthy Consultations:  GI    Discharge Diagnoses:  Principal Problem:   Upper GI bleed Active Problems:   Colitis   Essential hypertension   Acute blood loss anemia   Sepsis, unspecified organism (HCC)    Subjective: No complaints today.   Brief Summary: Marie Carpenter a 76 y.o.femalewith a past medical history significant for PUD followed by [facility], HTNwho presents with melena and diarrhea for 1 day. History of gastric ulcer last year treated for 3 mo with PPI.    Hospital Course:  Principal Problem:   Upper GI bleed - s/p EGD - has gastric ulcer and Barrett's Esophagus -cont PPI BID - path report to be followed by GI- asked patient to call office in 1 wk to obtain report  Active Problems: Acute blood loss anemia - mild drop in Hb from 10.8 to 9.6    Essential hypertension   Acute blood loss anemia - follow Hb and transfuse if < 8    Sepsis, unspecified organism  - leukocytosis with WBC count of 17.4 -  temp of 95.7 on admission - possible colitis on CT- no abdominal tenderness on exam but did present with severe diarrhea - on Levaquin and Flagyl- continue for total of 1 wk  Dehydration - resolved with IVF  HTN - holding Norvasc and Lisinopril as BP low normal- she has a BP monitor at home- advised to resume if BP > 120/80 at home  Discharge Instructions  Discharge Instructions    Diet - low sodium heart healthy    Complete by:  As directed    Increase activity slowly    Complete by:  As directed       Allergies as of 12/28/2016      Reactions   Hctz [hydrochlorothiazide] Other (See Comments)   hyponatremia   Penicillins Other (See Comments)   Has patient had a PCN reaction causing immediate rash, facial/tongue/throat swelling, SOB or lightheadedness with hypotension: no Has patient had a PCN reaction causing severe rash involving mucus membranes or skin necrosis: no Has patient had a PCN reaction that required hospitalization no Has patient had a PCN reaction occurring within the last 10 years: yes 5 years ago If all of the above answers are "NO", then may proceed with Cephalosporin use.      Medication List    STOP taking these medications   amLODipine 2.5 MG tablet Commonly known as:  NORVASC   lisinopril 20 MG tablet Commonly known as:  PRINIVIL,ZESTRIL     TAKE these medications   cholecalciferol 1000 units tablet Commonly known as:  VITAMIN D Take 2,000 Units by mouth daily.   levofloxacin 750 MG tablet Commonly known as:  LEVAQUIN Take 1 tablet (750 mg total) by mouth daily.   Magnesium 27 500 (27 Mg) MG Tabs Take 27 mg by mouth daily.   metroNIDAZOLE 500 MG tablet Commonly known as:  FLAGYL Take 1 tablet (500 mg total) by mouth 3 (three) times daily.   pantoprazole 40 MG tablet Commonly known as:  PROTONIX Take 1 tablet (40  mg total) by mouth 2 (two) times daily before a meal. Switch for any other PPI at similar dose and frequency       Allergies  Allergen Reactions  . Hctz [Hydrochlorothiazide] Other (See Comments)    hyponatremia  . Penicillins Other (See Comments)    Has patient had a PCN reaction causing immediate rash, facial/tongue/throat swelling, SOB or lightheadedness with hypotension: no Has patient had a PCN reaction causing severe rash involving mucus membranes or skin necrosis: no Has patient had a PCN reaction that required hospitalization no Has patient had a PCN reaction occurring within the last 10 years: yes 5 years ago If all of  the above answers are "NO", then may proceed with Cephalosporin use.     Procedures/Studies: EGD 1/16  Ct Abdomen Pelvis W Contrast  Addendum Date: 12/26/2016   ADDENDUM REPORT: 12/26/2016 21:17 ADDENDUM: Correction:  Findings, stomach/bowel: Proximal duodenum diverticulum. Electronically Signed   By: Awilda Metro M.D.   On: 12/26/2016 21:17   Result Date: 12/26/2016 CLINICAL DATA:  Bloody stools and dizziness today. History of diverticulosis. EXAM: CT ABDOMEN AND PELVIS WITH CONTRAST TECHNIQUE: Multidetector CT imaging of the abdomen and pelvis was performed using the standard protocol following bolus administration of intravenous contrast. CONTRAST:  ISOVUE-300 IOPAMIDOL (ISOVUE-300) INJECTION 61% COMPARISON:  CT abdomen and pelvis Apr 25, 2008 FINDINGS: LOWER CHEST: Lung bases are clear. Included heart size is normal. No pericardial effusion. Small LEFT fat containing diaphragmatic hernia. HEPATOBILIARY: Status post cholecystectomy.  Liver is normal. PANCREAS: Normal. SPLEEN: Normal. ADRENALS/URINARY TRACT: Kidneys are orthotopic, demonstrating symmetric enhancement. No nephrolithiasis, hydronephrosis or solid renal masses. Too small to characterize stable LEFT interpolar hypodensity. The unopacified ureters are normal in course and caliber. Delayed imaging through the kidneys demonstrates symmetric prompt contrast excretion within the proximal urinary collecting system. Urinary bladder is well distended and unremarkable. Normal adrenal glands. STOMACH/BOWEL: Small hiatal hernia. Evaluation for bowel pathology decreased without enteric contrast. Contiguous distal descending and rectosigmoid wall thickening and mucosal enhancement. Moderate sigmoid diverticulosis. Colonic and small bowel air-fluid levels. Proximal due weight Tinel diverticulum. In VASCULAR/LYMPHATIC: Aortoiliac vessels are normal in course and caliber, mild calcific atherosclerosis. No lymphadenopathy by CT size criteria.  REPRODUCTIVE: Normal. OTHER: No intraperitoneal free fluid or free air. MUSCULOSKELETAL: Nonacute. Mild degenerative change of the hips. LEFT lateral abdominal wall subcutaneous 2.3 x 3.9 cm globular low-density mass. Minimal grade 1 L4-5 anterolisthesis on degenerative basis. IMPRESSION: LEFT colitis. Moderate sigmoid diverticulosis without acute diverticulitis. LEFT lateral abdominal wall probable degenerating hematoma. Electronically Signed: By: Awilda Metro M.D. On: 12/26/2016 19:48   Dg Chest Portable 1 View  Result Date: 12/26/2016 CLINICAL DATA:  Blacked diarrhea beginning last night. Hypothermia. History of bleeding stomach ulcers. EXAM: PORTABLE CHEST 1 VIEW COMPARISON:  Chest radiograph February 06, 2016 FINDINGS: Cardiomediastinal silhouette is normal. Mildly calcified aortic knob. No pleural effusions or focal consolidations. Trachea projects midline and there is no pneumothorax. Soft tissue planes and included osseous structures are non-suspicious. 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 IMPRESSION: No active disease, stable examination. Electronically Signed   By: Awilda Metro M.D.   On: 12/26/2016 16:56       Discharge Exam: Vitals:   12/28/16 0425 12/28/16 0500  BP:  (!) 108/50  Pulse:  78  Resp:    Temp: 98.5 F (36.9 C)    Vitals:   12/28/16 0400 12/28/16 0425 12/28/16 0500 12/28/16 0803  BP: (!) 150/59  (!) 108/50   Pulse: (!) 102  78  Resp: 20     Temp:  98.5 F (36.9 C)    TempSrc:  Oral    SpO2: 98%  97% (!) 73%  Weight:      Height:        General: Pt is alert, awake, not in acute distress Cardiovascular: RRR, S1/S2 +, no rubs, no gallops Respiratory: CTA bilaterally, no wheezing, no rhonchi Abdominal: Soft, NT, ND, bowel sounds + Extremities: no edema, no cyanosis    The results of significant diagnostics from this hospitalization (including imaging, microbiology, ancillary and laboratory) are listed below for reference.      Microbiology: Recent Results (from the past 240 hour(s))  Blood culture (routine x 2)     Status: None (Preliminary result)   Collection Time: 12/26/16  6:30 PM  Result Value Ref Range Status   Specimen Description BLOOD LEFT ARM  Final   Special Requests BOTTLES DRAWN AEROBIC AND ANAEROBIC 5CC  Final   Culture NO GROWTH < 24 HOURS  Final   Report Status PENDING  Incomplete  Blood culture (routine x 2)     Status: None (Preliminary result)   Collection Time: 12/26/16  6:44 PM  Result Value Ref Range Status   Specimen Description BLOOD LEFT HAND  Final   Special Requests BOTTLES DRAWN AEROBIC AND ANAEROBIC  5CC  Final   Culture NO GROWTH < 24 HOURS  Final   Report Status PENDING  Incomplete  MRSA PCR Screening     Status: None   Collection Time: 12/26/16 10:54 PM  Result Value Ref Range Status   MRSA by PCR NEGATIVE NEGATIVE Final    Comment:        The GeneXpert MRSA Assay (FDA approved for NASAL specimens only), is one component of a comprehensive MRSA colonization surveillance program. It is not intended to diagnose MRSA infection nor to guide or monitor treatment for MRSA infections.   Urine culture     Status: None   Collection Time: 12/27/16 12:54 AM  Result Value Ref Range Status   Specimen Description URINE, RANDOM  Final   Special Requests NONE  Final   Culture NO GROWTH  Final   Report Status 12/28/2016 FINAL  Final  Gastrointestinal Panel by PCR , Stool     Status: None   Collection Time: 12/27/16  5:24 AM  Result Value Ref Range Status   Campylobacter species NOT DETECTED NOT DETECTED Final   Plesimonas shigelloides NOT DETECTED NOT DETECTED Final   Salmonella species NOT DETECTED NOT DETECTED Final   Yersinia enterocolitica NOT DETECTED NOT DETECTED Final   Vibrio species NOT DETECTED NOT DETECTED Final   Vibrio cholerae NOT DETECTED NOT DETECTED Final   Enteroaggregative E coli (EAEC) NOT DETECTED NOT DETECTED Final   Enteropathogenic E coli (EPEC)  NOT DETECTED NOT DETECTED Final   Enterotoxigenic E coli (ETEC) NOT DETECTED NOT DETECTED Final   Shiga like toxin producing E coli (STEC) NOT DETECTED NOT DETECTED Final   Shigella/Enteroinvasive E coli (EIEC) NOT DETECTED NOT DETECTED Final   Cryptosporidium NOT DETECTED NOT DETECTED Final   Cyclospora cayetanensis NOT DETECTED NOT DETECTED Final   Entamoeba histolytica NOT DETECTED NOT DETECTED Final   Giardia lamblia NOT DETECTED NOT DETECTED Final   Adenovirus F40/41 NOT DETECTED NOT DETECTED Final   Astrovirus NOT DETECTED NOT DETECTED Final   Norovirus GI/GII NOT DETECTED NOT DETECTED Final   Rotavirus A NOT DETECTED NOT DETECTED Final   Sapovirus (I, II, IV, and V) NOT DETECTED NOT DETECTED  Final  C difficile quick scan w PCR reflex     Status: None   Collection Time: 12/27/16  5:24 AM  Result Value Ref Range Status   C Diff antigen NEGATIVE NEGATIVE Final   C Diff toxin NEGATIVE NEGATIVE Final   C Diff interpretation No C. difficile detected.  Final     Labs: BNP (last 3 results) No results for input(s): BNP in the last 8760 hours. Basic Metabolic Panel:  Recent Labs Lab 12/26/16 1621 12/26/16 1629 12/26/16 2331 12/27/16 0957  NA 136 138  --  140  K 3.4* 4.5  --  4.2  CL 103 100*  --  113*  CO2 25  --   --  23  GLUCOSE 123* 130*  --  105*  BUN 47* 60*  --  17  CREATININE 0.90 0.80  --  0.54  CALCIUM 8.9  --   --  8.1*  MG  --   --  1.8  --    Liver Function Tests:  Recent Labs Lab 12/26/16 1621  AST 22  ALT 18  ALKPHOS 31*  BILITOT 0.4  PROT 5.7*  ALBUMIN 3.3*   No results for input(s): LIPASE, AMYLASE in the last 168 hours. No results for input(s): AMMONIA in the last 168 hours. CBC:  Recent Labs Lab 12/26/16 1621 12/26/16 1629 12/26/16 1845 12/26/16 2331 12/27/16 0957 12/27/16 1447  WBC 17.4*  --   --  13.7* 13.6* 11.6*  NEUTROABS 14.6*  --   --   --   --   --   HGB 10.8* 12.6 10.9* 9.5* 9.4* 9.6*  HCT 33.0* 37.0 32.1* 28.6* 27.8* 29.2*   MCV 94.6  --   --  94.7 95.2 95.4  PLT 274  --   --  239 227 221   Cardiac Enzymes: No results for input(s): CKTOTAL, CKMB, CKMBINDEX, TROPONINI in the last 168 hours. BNP: Invalid input(s): POCBNP CBG: No results for input(s): GLUCAP in the last 168 hours. D-Dimer No results for input(s): DDIMER in the last 72 hours. Hgb A1c No results for input(s): HGBA1C in the last 72 hours. Lipid Profile No results for input(s): CHOL, HDL, LDLCALC, TRIG, CHOLHDL, LDLDIRECT in the last 72 hours. Thyroid function studies No results for input(s): TSH, T4TOTAL, T3FREE, THYROIDAB in the last 72 hours.  Invalid input(s): FREET3 Anemia work up No results for input(s): VITAMINB12, FOLATE, FERRITIN, TIBC, IRON, RETICCTPCT in the last 72 hours. Urinalysis    Component Value Date/Time   COLORURINE YELLOW 12/27/2016 0054   APPEARANCEUR CLEAR 12/27/2016 0054   LABSPEC 1.032 (H) 12/27/2016 0054   PHURINE 5.0 12/27/2016 0054   GLUCOSEU NEGATIVE 12/27/2016 0054   HGBUR MODERATE (A) 12/27/2016 0054   BILIRUBINUR NEGATIVE 12/27/2016 0054   KETONESUR NEGATIVE 12/27/2016 0054   PROTEINUR NEGATIVE 12/27/2016 0054   NITRITE NEGATIVE 12/27/2016 0054   LEUKOCYTESUR SMALL (A) 12/27/2016 0054   Sepsis Labs Invalid input(s): PROCALCITONIN,  WBC,  LACTICIDVEN Microbiology Recent Results (from the past 240 hour(s))  Blood culture (routine x 2)     Status: None (Preliminary result)   Collection Time: 12/26/16  6:30 PM  Result Value Ref Range Status   Specimen Description BLOOD LEFT ARM  Final   Special Requests BOTTLES DRAWN AEROBIC AND ANAEROBIC 5CC  Final   Culture NO GROWTH < 24 HOURS  Final   Report Status PENDING  Incomplete  Blood culture (routine x 2)     Status: None (Preliminary result)   Collection Time: 12/26/16  6:44 PM  Result Value Ref Range Status   Specimen Description BLOOD LEFT HAND  Final   Special Requests BOTTLES DRAWN AEROBIC AND ANAEROBIC  5CC  Final   Culture NO GROWTH < 24 HOURS   Final   Report Status PENDING  Incomplete  MRSA PCR Screening     Status: None   Collection Time: 12/26/16 10:54 PM  Result Value Ref Range Status   MRSA by PCR NEGATIVE NEGATIVE Final    Comment:        The GeneXpert MRSA Assay (FDA approved for NASAL specimens only), is one component of a comprehensive MRSA colonization surveillance program. It is not intended to diagnose MRSA infection nor to guide or monitor treatment for MRSA infections.   Urine culture     Status: None   Collection Time: 12/27/16 12:54 AM  Result Value Ref Range Status   Specimen Description URINE, RANDOM  Final   Special Requests NONE  Final   Culture NO GROWTH  Final   Report Status 12/28/2016 FINAL  Final  Gastrointestinal Panel by PCR , Stool     Status: None   Collection Time: 12/27/16  5:24 AM  Result Value Ref Range Status   Campylobacter species NOT DETECTED NOT DETECTED Final   Plesimonas shigelloides NOT DETECTED NOT DETECTED Final   Salmonella species NOT DETECTED NOT DETECTED Final   Yersinia enterocolitica NOT DETECTED NOT DETECTED Final   Vibrio species NOT DETECTED NOT DETECTED Final   Vibrio cholerae NOT DETECTED NOT DETECTED Final   Enteroaggregative E coli (EAEC) NOT DETECTED NOT DETECTED Final   Enteropathogenic E coli (EPEC) NOT DETECTED NOT DETECTED Final   Enterotoxigenic E coli (ETEC) NOT DETECTED NOT DETECTED Final   Shiga like toxin producing E coli (STEC) NOT DETECTED NOT DETECTED Final   Shigella/Enteroinvasive E coli (EIEC) NOT DETECTED NOT DETECTED Final   Cryptosporidium NOT DETECTED NOT DETECTED Final   Cyclospora cayetanensis NOT DETECTED NOT DETECTED Final   Entamoeba histolytica NOT DETECTED NOT DETECTED Final   Giardia lamblia NOT DETECTED NOT DETECTED Final   Adenovirus F40/41 NOT DETECTED NOT DETECTED Final   Astrovirus NOT DETECTED NOT DETECTED Final   Norovirus GI/GII NOT DETECTED NOT DETECTED Final   Rotavirus A NOT DETECTED NOT DETECTED Final   Sapovirus (I,  II, IV, and V) NOT DETECTED NOT DETECTED Final  C difficile quick scan w PCR reflex     Status: None   Collection Time: 12/27/16  5:24 AM  Result Value Ref Range Status   C Diff antigen NEGATIVE NEGATIVE Final   C Diff toxin NEGATIVE NEGATIVE Final   C Diff interpretation No C. difficile detected.  Final     Time coordinating discharge: Over 30 minutes  SIGNED:   Calvert Cantor, MD  [facility] 12/28/2016, 10:28 AM Pager   If 7PM-7AM, please contact night-coverage [email or web address] Password [email or web address]

## 2016-12-28 NOTE — Progress Notes (Signed)
Pt left floor via wheelchair to discharge home. IV's removed. Pt verbalized discharge instructions. No sign of distress noted.

## 2016-12-28 NOTE — Discharge Instructions (Signed)
No Motrin, Naprosyn, Aspirin.  Avoid acidic food.  Check your BP daily and if > 120/80, restart your BP meds.   Please take all your medications with you for your next visit with your Primary MD. Please request your Primary MD to go over all hospital test results at the follow up. Please ask your Primary MD to get all Hospital records sent to his/her office.  If you experience worsening of your admission symptoms, develop shortness of breath, chest pain, suicidal or homicidal thoughts or a life threatening emergency, you must seek medical attention immediately by calling 911 or calling your MD.  Bonita QuinYou must read the complete instructions/literature along with all the possible adverse reactions/side effects for all the medicines you take including new medications that have been prescribed to you. Take new medicines after you have completely understood and accpet all the possible adverse reactions/side effects.   Do not drive when taking pain medications or sedatives.    Do not take more than prescribed Pain, Sleep and Anxiety Medications  If you have smoked or chewed Tobacco in the last 2 yrs please stop. Stop any regular alcohol and or recreational drug use.  Wear Seat belts while driving.

## 2016-12-28 NOTE — Anesthesia Postprocedure Evaluation (Signed)
Anesthesia Post Note  Patient: Marie Carpenter  Procedure(s) Performed: Procedure(s) (LRB): ESOPHAGOGASTRODUODENOSCOPY (EGD) (N/A)  Patient location during evaluation: PACU Anesthesia Type: MAC Level of consciousness: awake and alert Pain management: pain level controlled Vital Signs Assessment: post-procedure vital signs reviewed and stable Respiratory status: spontaneous breathing, nonlabored ventilation, respiratory function stable and patient connected to nasal cannula oxygen Cardiovascular status: stable and blood pressure returned to baseline Anesthetic complications: no       Last Vitals:  Vitals:   12/28/16 0425 12/28/16 0500  BP:  (!) 108/50  Pulse:  78  Resp:    Temp: 36.9 C     Last Pain:  Vitals:   12/28/16 0425  TempSrc: Oral  PainSc:                  Tiajuana Amass

## 2016-12-29 ENCOUNTER — Other Ambulatory Visit: Payer: Medicare Other

## 2016-12-30 LAB — TYPE AND SCREEN
ABO/RH(D): O NEG
Antibody Screen: NEGATIVE
UNIT DIVISION: 0
Unit division: 0

## 2016-12-31 LAB — CULTURE, BLOOD (ROUTINE X 2)
CULTURE: NO GROWTH
Culture: NO GROWTH

## 2017-01-27 ENCOUNTER — Other Ambulatory Visit: Payer: Self-pay | Admitting: Obstetrics and Gynecology

## 2017-06-05 ENCOUNTER — Ambulatory Visit (INDEPENDENT_AMBULATORY_CARE_PROVIDER_SITE_OTHER): Payer: Medicare Other | Admitting: Neurology

## 2017-06-05 ENCOUNTER — Encounter: Payer: Self-pay | Admitting: Neurology

## 2017-06-05 ENCOUNTER — Other Ambulatory Visit (INDEPENDENT_AMBULATORY_CARE_PROVIDER_SITE_OTHER): Payer: Medicare Other

## 2017-06-05 VITALS — BP 150/90 | HR 78 | Ht 63.5 in | Wt 155.6 lb

## 2017-06-05 DIAGNOSIS — G629 Polyneuropathy, unspecified: Secondary | ICD-10-CM | POA: Diagnosis not present

## 2017-06-05 LAB — FOLATE: FOLATE: 15.5 ng/mL (ref >5.9)

## 2017-06-05 NOTE — Patient Instructions (Addendum)
1.  Check labs  2.  Referral to physical therapy for balance training  [facility]  Preventing Falls in the Home   Falls are common, often dreaded events in the lives of older people. Aside from the obvious injuries and even death that may result, falls can cause wide-ranging consequences including loss of independence, mental decline, decreased activity, and mobility. Younger people are also at risk of falling, especially those with chronic illnesses and fatigue.  Ways to reduce the risk for falling:  * Examine diet and medications. Warm foods and alcohol dilate blood vessels, which can lead to dizziness when standing. Sleep aids, antidepressants, and pain medications can also increase the likelihood of a fall.  * Get a vison exam. Poor vision, cataracts, and glaucoma increase the chances of falling.  * Check foot gear. Shoes should fit snugly and have a sturdy, nonskid sole and broad, low heel.  * Participate in a physician-approved exercise program to build and maintain muscle strength and improve balance and coordination.  * Increase vitamin D intake. Vitamin D improves muscle strength and increases the amount of calcium the body is able to absorb and deposit in bones.  How to prevent falls from common hazards:  * Floors - Remove all loose wires, cords, and throw rugs. Minimize clutter. Make sure rugs are anchored and smooth. Keep furniture in its usual place.  * Chairs - Use chairs with straight backs, armrests, and firm seats. Add firm cushions to existing pieces to add height.  * Bathroom - Install grab bars and non-skid tape in the tub or shower. Use a bathtub transfer bench or a shower chair with a back support. Use an elevated toilet seat and/or safety rails to assist standing from a low surface. Do not use towel racks or bathroom tissue holders to help you stand.  * Lighting - Make sure halls, stairways, and entrances are well-lit. Install a night light in your bathroom or hallway.  Make sure there is a light switch at the top and bottom of the staircase. Turn lights on if you get up in the middle of the night. Make sure lamps or light switches are within reach of the bed if you have to get up during the night.  * Kitchen - Install non-skid rubber mats near the sink and stove. Clean spills immediately. Store frequently used utensils, pots, and pans between waist and eye level. This helps prevent reaching and bending. Sit when getting things out of the lower cupboards.  * Living room / Bedrooms - Place furniture with wide spaces in between, giving enough room to move around. Establish a route through the living room that gives you something to hold onto as you walk.  * Stairs - Make sure treads, rails, and rugs are secure. Install a rail on both sides of the stairs. If stairs are a threat, it might be helpful to arrange most of your activities on the lower level to reduce the number of times you must climb the stairs.  * Entrances and doorways - Install metal handles on the walls adjacent to the doorknobs of all doors to make it more secure as you travel through the doorway.  Tips for maintaining balance:  * Keep at least one hand free at all times Try using a backpack or fanny pack to hold things rather than carrying them in your hands. Never carry objects in both hands when walking as this interferes with keeping your balance.  * Attempt to swing both  arms from front to back while walking. This might require a conscious effort if Parkinson's disease has diminished your movement. It will, however, help you to maintain balance and posture, and reduce fatigue.  * Consciously lift your feet off the ground when walking. Shuffling and dragging of the feet is a common culprit in losing your balance.  * When trying to navigate turns, use a "U" technique of facing forward and making a wide turn, rather than pivoting sharply.  * Try to stand with your feet shoulder-length apart. When your feet  are close together for any length of time, you increase your risk of losing your balance and falling.  * Do one thing at a time. Do not try to walk and accomplish another task, such as reading or looking around. The decrease in your automatic reflexes complicates motor function, so the less distraction, the better.  * Do not wear rubber or gripping soled shoes, they might "catch" on the floor and cause tripping.  * Move slowly when changing positions. Use deliberate, concentrated movements and, if needed, use a grab bar or walking aid. Count fifteen (15) seconds after standing to begin walking.  * If balance is a continuous problem, you might want to consider a walking aid such as a cane, walking stick, or walker. Once you have mastered walking with help, you may be ready to try it again on your own.  This information is provided by [facility] and is not intended to replace the medical advice of your physician or other health care providers. Please consult your physician or other health care providers for advice regarding your specific medical condition.

## 2017-06-05 NOTE — Progress Notes (Signed)
[facility] Clinic Note - Initial Visit   Date: 06/05/17  Marie Carpenter MRN: [id number] DOB: [birth year]   Dear Dr. Cliffton Asters:   Thank you for your kind referral of Marie Carpenter for consultation of peripheral neuropathy. Although her history is well known to you, please allow [place] to reiterate it for the purpose of our medical record. The patient was accompanied to the clinic by self.    History of Present Illness: Marie Carpenter is a 76 y.o. right-handed Caucasian female with hypertension, diet-controlled diabetes, and PUD presenting for evaluation of neuropathy.    Starting around around 10 years ago, she began having tingling in the toes.  Over the years, her neuropathy has progressed to involve her lower legs.  She denies any severe burning or tingling sensation.  Ocasionally, she has sharp pain.   She has very mild tingling involving the fingertips.  She denies any difficulty with fine motor tasks or weakness.   About 2-3 years ago, she began having lightheadedness and imbalance, worse when walking on grass and sand. [place] and now she tends to visit less frequently, because of her imbalance.     Upon further questioning, she admits to drinking 2-3 glasses of wine about 4-5 nights per week for many years.  She started reducing her alcohol intake over the past 10 years.  She now only has it very occasionally.  She was found to have vitamin B12 deficiency and started on oral supplementation.  Her last lab showed improved vitamin B12 levels.  She had prediabetes and manages this with diet and exercise, her lab HbA1c was 6.3 which is higher than usual.  No family history of neuropathy.  Out-side paper records, electronic medical record, and images have been reviewed where available and summarized as:  Labs 03/27/2017:  HbA1c 6.3, vitamin B12 166*,   Past Medical History:  Diagnosis Date  . Barrett's esophagus 02/07/2016  . Essential  hypertension 02/06/2016  . Hiatal hernia 02/07/2016  . Hypertension   . Osteoporosis 02/06/2016  . PUD (peptic ulcer disease) 02/07/2016  . UGI bleed 02/06/2016    Past Surgical History:  Procedure Laterality Date  . ESOPHAGOGASTRODUODENOSCOPY Left 02/07/2016   Procedure: ESOPHAGOGASTRODUODENOSCOPY (EGD);  Surgeon: Dorena Cookey, MD;  Location: [facility];  Service: Endoscopy;  Laterality: Left;  . ESOPHAGOGASTRODUODENOSCOPY N/A 12/27/2016   Procedure: ESOPHAGOGASTRODUODENOSCOPY (EGD);  Surgeon: Graylin Shiver, MD;  Location: [facility] ENDOSCOPY;  Service: Endoscopy;  Laterality: N/A;     Medications:  Outpatient Encounter Prescriptions as of 06/05/2017  Medication Sig  . amLODipine (NORVASC) 5 MG tablet   . cholecalciferol (VITAMIN D) 1000 units tablet Take 2,000 Units by mouth daily.   . clobetasol ointment (TEMOVATE) 0.05 %   . lisinopril (PRINIVIL,ZESTRIL) 20 MG tablet   . pantoprazole (PROTONIX) 40 MG tablet Take 1 tablet (40 mg total) by mouth 2 (two) times daily before a meal. Switch for any other PPI at similar dose and frequency  . [DISCONTINUED] levofloxacin (LEVAQUIN) 750 MG tablet Take 1 tablet (750 mg total) by mouth daily.  . [DISCONTINUED] Magnesium Gluconate (MAGNESIUM 27) 500 (27 Mg) MG TABS Take 27 mg by mouth daily.  . [DISCONTINUED] metroNIDAZOLE (FLAGYL) 500 MG tablet Take 1 tablet (500 mg total) by mouth 3 (three) times daily.   No facility-administered encounter medications on file as of 06/05/2017.      Allergies:  Allergies  Allergen Reactions  . Hctz [Hydrochlorothiazide] Other (See Comments)  hyponatremia  . Penicillins Other (See Comments)    Has patient had a PCN reaction causing immediate rash, facial/tongue/throat swelling, SOB or lightheadedness with hypotension: no Has patient had a PCN reaction causing severe rash involving mucus membranes or skin necrosis: no Has patient had a PCN reaction that required hospitalization no Has patient had a PCN reaction  occurring within the last 10 years: yes 5 years ago If all of the above answers are "NO", then may proceed with Cephalosporin use.    Family History: Family History  Problem Relation Age of Onset  . Heart disease Father   . Alzheimer's disease Mother   . Other Brother   . Diabetes Maternal Grandfather     Social History: Social History  Substance Use Topics  . Smoking status: Former Games developermoker  . Smokeless tobacco: Never Used  . Alcohol use No   Social History   Social History Narrative   Lives alone in a one story home.  Has 3 children.  Retired Runner, broadcasting/film/videoteacher.  Education: college.     Review of Systems:  CONSTITUTIONAL: No fevers, chills, night sweats, or weight loss.   EYES: No visual changes or eye pain ENT: No hearing changes.  No history of nose bleeds.   RESPIRATORY: No cough, wheezing and shortness of breath.   CARDIOVASCULAR: Negative for chest pain, and palpitations.   GI: Negative for abdominal discomfort, blood in stools or black stools.  No recent change in bowel habits.   GU:  No history of incontinence.   MUSCLOSKELETAL: No history of joint pain or swelling.  No myalgias.   SKIN: Negative for lesions, rash, and itching.   HEMATOLOGY/ONCOLOGY: Negative for prolonged bleeding, bruising easily, and swollen nodes.  No history of cancer.   ENDOCRINE: Negative for cold or heat intolerance, polydipsia or goiter.   PSYCH:  No depression or anxiety symptoms.   NEURO: As Above.   Vital Signs:  BP (!) 150/90   Pulse 78   Ht 5' 3.5" (1.613 m)   Wt 155 lb 9 oz (70.6 kg)   SpO2 93%   BMI 27.12 kg/m    General Medical Exam:   General:  Well appearing, comfortable.   Eyes/ENT: see cranial nerve examination.   Neck: No masses appreciated.  Full range of motion without tenderness.  No carotid bruits. Respiratory:  Clear to auscultation, good air entry bilaterally.   Cardiac:  Regular rate and rhythm, no murmur.   Extremities:  No deformities, edema, or skin discoloration.    Skin:  No rashes or lesions.  Neurological Exam: MENTAL STATUS including orientation to time, place, person, recent and remote memory, attention span and concentration, language, and fund of knowledge is normal.  Speech is not dysarthric.  CRANIAL NERVES: II:  No visual field defects.  Unremarkable fundi.   III-IV-VI: Pupils equal round and reactive to light.  Normal conjugate, extra-ocular eye movements in all directions of gaze.  No nystagmus.  No ptosis.   V:  Normal facial sensation.     VII:  Normal facial symmetry and movements.    VIII:  Normal hearing and vestibular function.   IX-X:  Normal palatal movement.   XI:  Normal shoulder shrug and head rotation.   XII:  Normal tongue strength and range of motion, no deviation or fasciculation.  MOTOR:  No atrophy, fasciculations or abnormal movements.  No pronator drift.  Tone is normal.    Right Upper Extremity:    Left Upper Extremity:    Deltoid  5/5   Deltoid  5/5   Biceps  5/5   Biceps  5/5   Triceps  5/5   Triceps  5/5   Wrist extensors  5/5   Wrist extensors  5/5   Wrist flexors  5/5   Wrist flexors  5/5   Finger extensors  5/5   Finger extensors  5/5   Finger flexors  5/5   Finger flexors  5/5   Dorsal interossei  5/5   Dorsal interossei  5/5   Abductor pollicis  5/5   Abductor pollicis  5/5   Tone (Ashworth scale)  0  Tone (Ashworth scale)  0   Right Lower Extremity:    Left Lower Extremity:    Hip flexors  5/5   Hip flexors  5/5   Hip extensors  5/5   Hip extensors  5/5   Knee flexors  5/5   Knee flexors  5/5   Knee extensors  5/5   Knee extensors  5/5   Dorsiflexors  5/5   Dorsiflexors  5/5   Plantarflexors  5/5   Plantarflexors  5/5   Toe extensors  5/5   Toe extensors  5/5   Toe flexors  5/5   Toe flexors  5/5   Tone (Ashworth scale)  0  Tone (Ashworth scale)  0   MSRs:  Right                                                                 Left brachioradialis 2+  brachioradialis 2+  biceps 2+  biceps 2+   triceps 2+  triceps 2+  patellar 2+  patellar 2+  ankle jerk 0  ankle jerk 0  Hoffman no  Hoffman no  plantar response down  plantar response down   SENSORY:  Vibration is trace at the great toe, intact above the ankles.  Temperature, pin prick, and light touch is intact.  Mild impairment with proprioception.  There is sway with Rhomberg testing.  COORDINATION/GAIT: Normal finger-to- nose-finger.  Intact rapid alternating movements bilaterally.  Gait is slightly wide-based mild ataxia, unassisted. She is very unsteady with tandem gait.  Stressed gait is intact.   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  Her neurological examination shows a distal predominant large fiber peripheral neuropathy. I had extensive discussion with the patient regarding the pathogenesis, etiology, management, and natural course of neuropathy. With her history of alcohol use, this is the primary contributor and secondary is prediabetes. I praised her for cutting back on her alcohol intake as this could potentially slow down progression.  Life style modification for prediabetes was encouraged.  She was also recently found to have B12 deficency and noticed that imbalance is less severe.  Laboratory testing will include vitamin B1, copper, folate, SPEP with IFE.  For her imbalance, she will start physical therapy for balance training.  I had a lengthy discussion on fall precautions and recommend using a cane, especially on uneven surfaces.  Return to clinic as needed.   The duration of this appointment visit was 40 minutes of face-to-face time with the patient.  Greater than 50% of this time was spent in counseling, explanation of diagnosis, planning of further management, and coordination of care.   Thank you for allowing  me to participate in patient's care.  If I can answer any additional questions, I would be pleased to do so.    Sincerely,    Donika K. Posey Pronto, DO

## 2017-06-07 LAB — COPPER, SERUM: Copper: 86 ug/dL (ref 70–175)

## 2017-06-08 LAB — IMMUNOFIXATION ELECTROPHORESIS
IgA: 141 mg/dL (ref 81–463)
IgG (Immunoglobin G), Serum: 951 mg/dL (ref 694–1618)
IgM, Serum: 98 mg/dL (ref 48–271)

## 2017-06-08 LAB — PROTEIN ELECTROPHORESIS, SERUM
ALBUMIN ELP: 4 g/dL (ref 3.8–4.8)
ALPHA-2-GLOBULIN: 0.6 g/dL (ref 0.5–0.9)
Alpha-1-Globulin: 0.3 g/dL (ref 0.2–0.3)
BETA GLOBULIN: 0.5 g/dL (ref 0.4–0.6)
Beta 2: 0.3 g/dL (ref 0.2–0.5)
Gamma Globulin: 0.9 g/dL (ref 0.8–1.7)
TOTAL PROTEIN, SERUM ELECTROPHOR: 6.6 g/dL (ref 6.1–8.1)

## 2017-06-09 LAB — VITAMIN B1: Vitamin B1 (Thiamine): 13 nmol/L (ref 8–30)

## 2017-06-12 ENCOUNTER — Telehealth: Payer: Self-pay | Admitting: *Deleted

## 2017-06-12 NOTE — Telephone Encounter (Signed)
-----   Message from [place] Chardonika K Patel, DO sent at 06/12/2017  8:10 AM EDT ----- Please notify patient lab looking for other causes of neuropathy returned normal.  Thank you.

## 2017-06-12 NOTE — Telephone Encounter (Signed)
Patient notified that her labs are normal.  

## 2018-01-16 IMAGING — CR DG FOOT COMPLETE 3+V*R*
3 series · 3 of 3 positions shown · non-contrast
Comparison: None.

CLINICAL DATA: Stubbed RIGHT 2ND phalanges 2 weeks ago / as day
progresses continued pain and redness appears / concern for FX

EXAM:
RIGHT FOOT COMPLETE - 3+ VIEW

[x foot ap right]
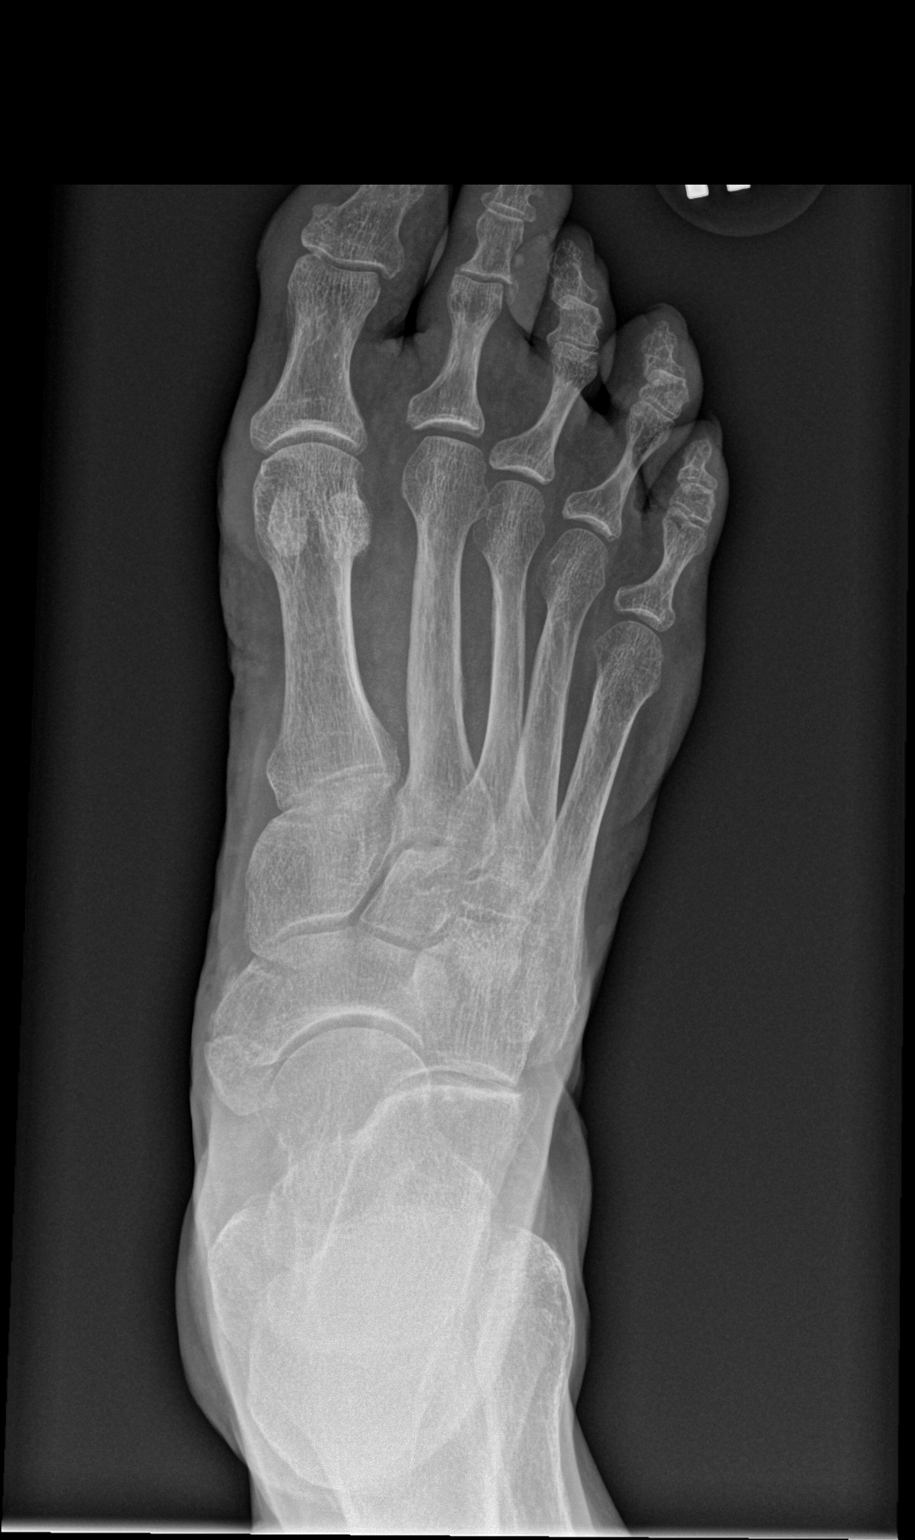

[x foot obl right]
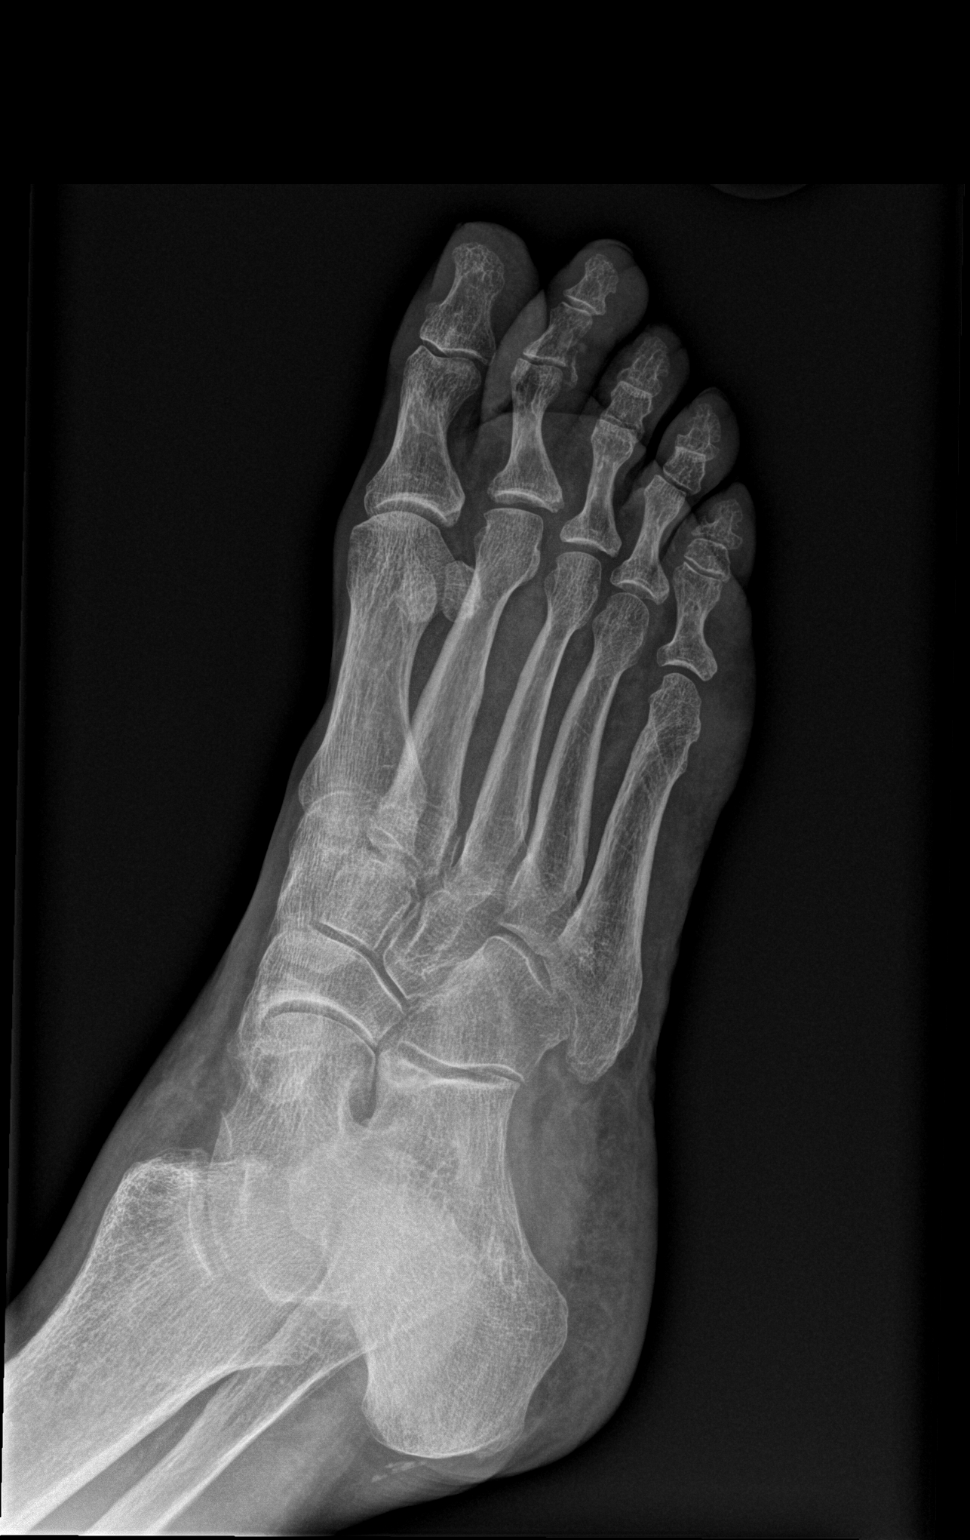

[x foot lat right]
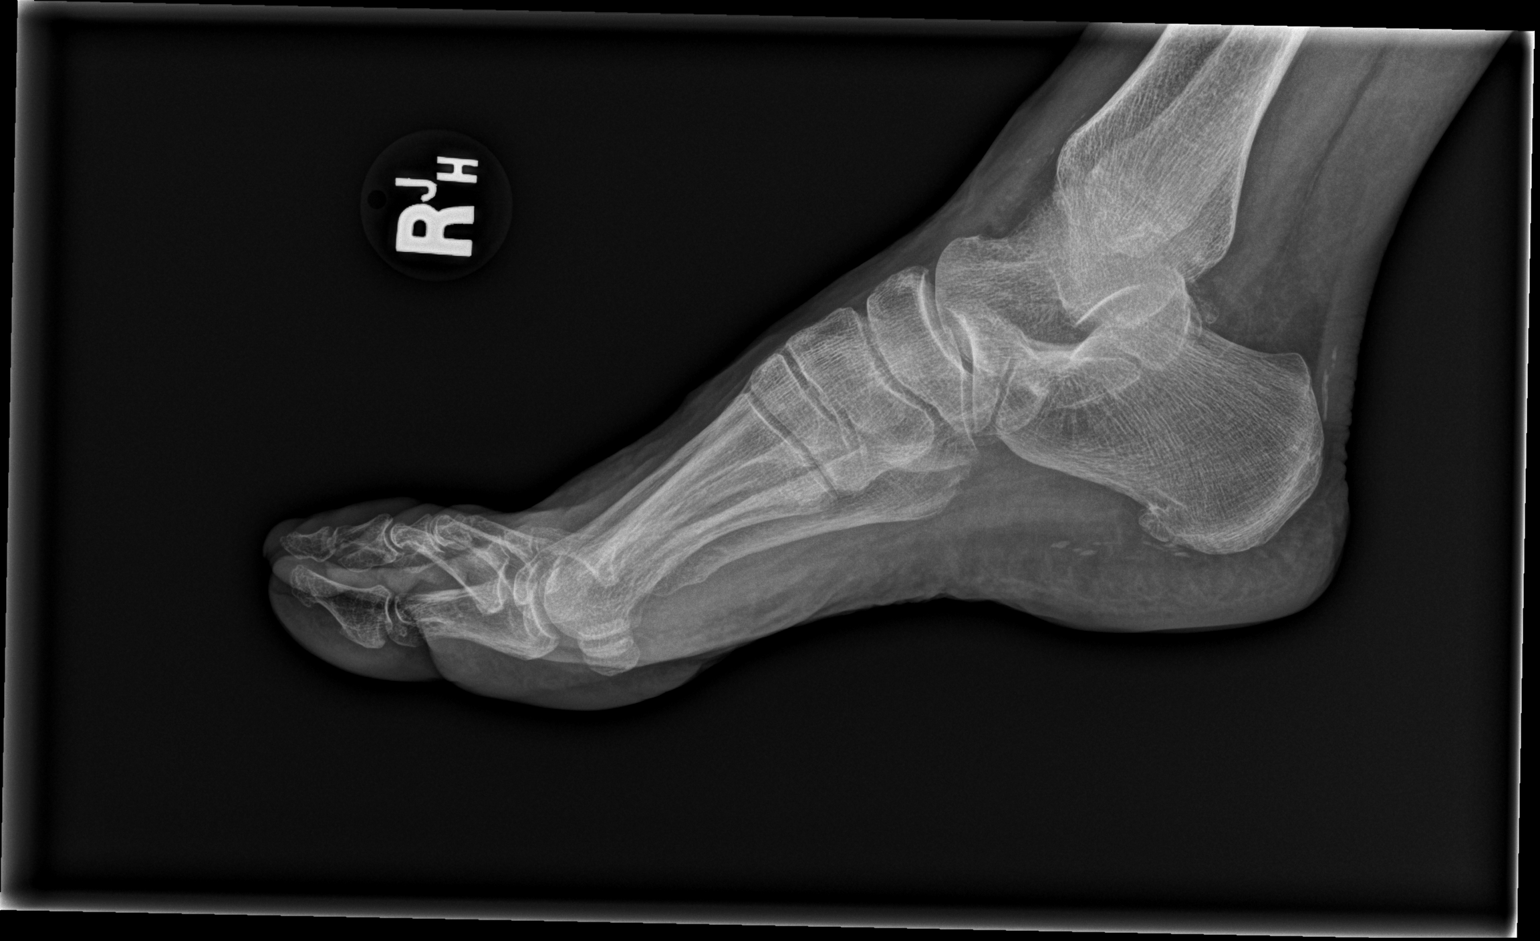

[3 of 3 positions shown; findings below may reference images not displayed]

FINDINGS: There is a fracture of the middle phalanx of the second toe. There
are bone fragments along the plantar lateral margin of the proximal
to mid aspect of the middle phalanx consistent with a comminuted
fracture along its plantar lateral margin. These fracture fragments
are slightly displaced, approximately 1 mm.

No other fractures.  There is second toe soft tissue swelling.

Joints are normally spaced and aligned.

Moderate plantar calcaneal spur.
IMPRESSION: 1. Comminuted fracture of the middle phalanx of the right second toe
as described. No dislocation.

## 2018-10-17 ENCOUNTER — Other Ambulatory Visit: Payer: Self-pay | Admitting: Family Medicine

## 2018-10-17 ENCOUNTER — Ambulatory Visit
Admission: RE | Admit: 2018-10-17 | Discharge: 2018-10-17 | Disposition: A | Discharge status: Home / Self Care | Payer: Medicare Other | Source: Ambulatory Visit | Attending: Family Medicine | Admitting: Family Medicine

## 2018-10-17 DIAGNOSIS — R52 Pain, unspecified: Secondary | ICD-10-CM

## 2020-01-10 ENCOUNTER — Ambulatory Visit: Payer: Self-pay

## 2020-01-16 ENCOUNTER — Ambulatory Visit: Payer: Medicare Other | Attending: Internal Medicine

## 2020-01-18 ENCOUNTER — Ambulatory Visit: Payer: Medicare Other | Attending: Internal Medicine

## 2020-01-18 DIAGNOSIS — Z23 Encounter for immunization: Secondary | ICD-10-CM | POA: Insufficient documentation

## 2020-01-18 NOTE — Progress Notes (Signed)
   Covid-19 Vaccination Clinic  Name:  Marie Carpenter    MRN: [id number] DOB: 1941/08/08  01/18/2020  [name] was observed post Covid-19 immunization for 15 minutes without incidence. She was provided with Vaccine Information Sheet and instruction to access the V-Safe system.   [name] was instructed to call 911 with any severe reactions post vaccine: Marland Kitchen Difficulty breathing  . Swelling of your face and throat  . A fast heartbeat  . A bad rash all over your body  . Dizziness and weakness    Immunizations Administered    Name Date Dose VIS Date Route   Pfizer COVID-19 Vaccine 01/18/2020  9:01 AM 0.3 mL 11/22/2019 Intramuscular   Manufacturer: [employer], [employer]   Lot: [id number]   NDC: [address]-8

## 2020-01-27 ENCOUNTER — Ambulatory Visit: Payer: Self-pay

## 2020-02-12 ENCOUNTER — Ambulatory Visit: Payer: Medicare Other | Attending: Internal Medicine

## 2020-02-12 DIAGNOSIS — Z23 Encounter for immunization: Secondary | ICD-10-CM | POA: Insufficient documentation

## 2020-02-12 NOTE — Progress Notes (Signed)
   Covid-19 Vaccination Clinic  Name:  Marie Carpenter    MRN: [id number] DOB: 21-Jan-1941  02/12/2020  [name] was observed post Covid-19 immunization for 15 minutes without incident. She was provided with Vaccine Information Sheet and instruction to access the V-Safe system.   [name] was instructed to call 911 with any severe reactions post vaccine: Marland Kitchen Difficulty breathing  . Swelling of face and throat  . A fast heartbeat  . A bad rash all over body  . Dizziness and weakness   Immunizations Administered    Name Date Dose VIS Date Route   Pfizer COVID-19 Vaccine 02/12/2020  9:53 AM 0.3 mL 11/22/2019 Intramuscular   Manufacturer: [employer], [employer]   Lot: [id number]   NDC: [address]-4

## 2020-12-24 DIAGNOSIS — I10 Essential (primary) hypertension: Secondary | ICD-10-CM | POA: Diagnosis not present

## 2020-12-24 DIAGNOSIS — G629 Polyneuropathy, unspecified: Secondary | ICD-10-CM | POA: Diagnosis not present

## 2020-12-24 DIAGNOSIS — L900 Lichen sclerosus et atrophicus: Secondary | ICD-10-CM | POA: Diagnosis not present

## 2020-12-24 DIAGNOSIS — R7309 Other abnormal glucose: Secondary | ICD-10-CM | POA: Diagnosis not present

## 2020-12-24 DIAGNOSIS — Z0000 Encounter for general adult medical examination without abnormal findings: Secondary | ICD-10-CM | POA: Diagnosis not present

## 2020-12-24 DIAGNOSIS — K2270 Barrett's esophagus without dysplasia: Secondary | ICD-10-CM | POA: Diagnosis not present

## 2020-12-24 DIAGNOSIS — Z1389 Encounter for screening for other disorder: Secondary | ICD-10-CM | POA: Diagnosis not present

## 2020-12-24 DIAGNOSIS — M810 Age-related osteoporosis without current pathological fracture: Secondary | ICD-10-CM | POA: Diagnosis not present

## 2020-12-24 DIAGNOSIS — E538 Deficiency of other specified B group vitamins: Secondary | ICD-10-CM | POA: Diagnosis not present

## 2020-12-24 DIAGNOSIS — E559 Vitamin D deficiency, unspecified: Secondary | ICD-10-CM | POA: Diagnosis not present

## 2020-12-24 DIAGNOSIS — Z1159 Encounter for screening for other viral diseases: Secondary | ICD-10-CM | POA: Diagnosis not present

## 2021-03-08 DIAGNOSIS — Z1231 Encounter for screening mammogram for malignant neoplasm of breast: Secondary | ICD-10-CM | POA: Diagnosis not present

## 2021-03-10 DIAGNOSIS — E119 Type 2 diabetes mellitus without complications: Secondary | ICD-10-CM | POA: Diagnosis not present

## 2021-03-19 ENCOUNTER — Other Ambulatory Visit: Payer: Self-pay

## 2021-03-19 ENCOUNTER — Ambulatory Visit: Payer: Medicare Other | Admitting: Neurology

## 2021-03-19 ENCOUNTER — Encounter: Payer: Self-pay | Admitting: Neurology

## 2021-03-19 VITALS — BP 156/70 | HR 77 | Ht 63.0 in | Wt 152.2 lb

## 2021-03-19 DIAGNOSIS — G5603 Carpal tunnel syndrome, bilateral upper limbs: Secondary | ICD-10-CM

## 2021-03-19 DIAGNOSIS — M25462 Effusion, left knee: Secondary | ICD-10-CM | POA: Diagnosis not present

## 2021-03-19 DIAGNOSIS — M11262 Other chondrocalcinosis, left knee: Secondary | ICD-10-CM | POA: Diagnosis not present

## 2021-03-19 DIAGNOSIS — M25562 Pain in left knee: Secondary | ICD-10-CM | POA: Diagnosis not present

## 2021-03-19 DIAGNOSIS — G629 Polyneuropathy, unspecified: Secondary | ICD-10-CM

## 2021-03-19 NOTE — Patient Instructions (Addendum)
Nerve testing of the hands.  Do not apply lotion or oil to your skin on the day of testing  Start using wrist braces at nighttime.  Please see your primary care doctor or urgent care about your left knee pain.   ELECTROMYOGRAM AND NERVE CONDUCTION STUDIES (EMG/NCS) INSTRUCTIONS  How to Prepare The neurologist conducting the EMG will need to know if you have certain medical conditions. Tell the neurologist and other EMG lab personnel if you: . Have a pacemaker or any other electrical medical device . Take blood-thinning medications . Have hemophilia, a blood-clotting disorder that causes prolonged bleeding Bathing Take a shower or bath shortly before your exam in order to remove oils from your skin. Don't apply lotions or creams before the exam.  What to Expect You'll likely be asked to change into a hospital gown for the procedure and lie down on an examination table. The following explanations can help you understand what will happen during the exam.  . Electrodes. The neurologist or a technician places surface electrodes at various locations on your skin depending on where you're experiencing symptoms. Or the neurologist may insert needle electrodes at different sites depending on your symptoms.  . Sensations. The electrodes will at times transmit a tiny electrical current that you may feel as a twinge or spasm. The needle electrode may cause discomfort or pain that usually ends shortly after the needle is removed. If you are concerned about discomfort or pain, you may want to talk to the neurologist about taking a short break during the exam.  . Instructions. During the needle EMG, the neurologist will assess whether there is any spontaneous electrical activity when the muscle is at rest - activity that isn't present in healthy muscle tissue - and the degree of activity when you slightly contract the muscle.  He or she will give you instructions on resting and contracting a muscle at  appropriate times. Depending on what muscles and nerves the neurologist is examining, he or she may ask you to change positions during the exam.  After your EMG You may experience some temporary, minor bruising where the needle electrode was inserted into your muscle. This bruising should fade within several days. If it persists, contact your primary care doctor.

## 2021-03-19 NOTE — Progress Notes (Signed)
[facility] Clinic Note - Initial Visit   Date: 03/19/21  Marie Carpenter MRN: [id number] DOB: 09-Feb-1941   Dear Dr. Cliffton Asters:  Thank you for your kind referral of Marie Carpenter for consultation of bilateral hand numbness. Although her history is well known to you, please allow [place] to reiterate it for the purpose of our medical record. The patient was accompanied to the clinic by self.    History of Present Illness: Marie Carpenter is a 80 y.o. right-handed female with hypertension, peptic ulcer disease, and neuropathy presenting for evaluation of bilateral hand numbness.   She has numbness/tingling of the hands for the past several years, but about 9 months ago, she began having worsening numbness in the thumb, index finger, and middle finger which is worse on the right.  She was recommended to use wrist brace by her PCP due to concern of carpal tunnel syndrome, and tried it for about a month.  She has difficulty with holding a pen, utensils, and opening jars/bottles.  She does not have neck pain or radicular arm pain.   She was seen in 2018 here for neuropathy in the feet.  This remains in the toes and continues to have numbness. She completed PT which helped her balance.  She walks unassisted. She has not suffered any falls. 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    For the past week, she has been having left medial knee pain, which is very sharp and worse with walking or standing. She has not seen her PCP for this yet.  Out-side paper records, electronic medical record, and images have been reviewed where available and summarized as:  No results found for: HGBA1C Lab Results  Component Value Date   VITAMINB12 103 (L) 02/06/2016   No results found for: TSH   Past Medical History:  Diagnosis Date  . Barrett's esophagus 02/07/2016  . Essential hypertension 02/06/2016  .  Hiatal hernia 02/07/2016  . Hypertension   . Osteoporosis 02/06/2016  . PUD (peptic ulcer disease) 02/07/2016  . UGI bleed 02/06/2016    Past Surgical History:  Procedure Laterality Date  . ESOPHAGOGASTRODUODENOSCOPY Left 02/07/2016   Procedure: ESOPHAGOGASTRODUODENOSCOPY (EGD);  Surgeon: Dorena Cookey, MD;  Location: [facility];  Service: Endoscopy;  Laterality: Left;  . ESOPHAGOGASTRODUODENOSCOPY N/A 12/27/2016   Procedure: ESOPHAGOGASTRODUODENOSCOPY (EGD);  Surgeon: Graylin Shiver, MD;  Location: [facility] ENDOSCOPY;  Service: Endoscopy;  Laterality: N/A;     Medications:  Outpatient Encounter Medications as of 03/19/2021  Medication Sig  . cholecalciferol (VITAMIN D) 1000 units tablet Take 2,000 Units by mouth daily.   Marland Kitchen denosumab (PROLIA) 60 MG/ML SOSY injection Inject 60 mLs into the skin.  Marland Kitchen lisinopril (PRINIVIL,ZESTRIL) 20 MG tablet Take 10 mg by mouth daily.  . pantoprazole (PROTONIX) 40 MG tablet Take 1 tablet (40 mg total) by mouth 2 (two) times daily before a meal. Switch for any other PPI at similar dose and frequency  . amLODipine (NORVASC) 5 MG tablet  (Patient not taking: Reported on 03/19/2021)  . clobetasol ointment (TEMOVATE) 0.05 %    No facility-administered encounter medications on file as of 03/19/2021.    Allergies:  Allergies  Allergen Reactions  . Hctz [Hydrochlorothiazide] Other (See Comments)    hyponatremia  . Penicillins Other (See Comments)    Has patient had a PCN reaction causing immediate rash, facial/tongue/throat swelling, SOB or lightheadedness with hypotension: no  Has patient had a PCN reaction causing severe rash involving mucus membranes or skin necrosis: no Has patient had a PCN reaction that required hospitalization no Has patient had a PCN reaction occurring within the last 10 years: yes 5 years ago If all of the above answers are "NO", then may proceed with Cephalosporin use.    Family History: Family History  Problem Relation Age of Onset  . Heart  disease Father   . Alzheimer's disease Mother   . Other Brother   . Diabetes Maternal Grandfather     Social History: Social History   Tobacco Use  . Smoking status: Former Games developer  . Smokeless tobacco: Never Used  Vaping Use  . Vaping Use: Never used  Substance Use Topics  . Alcohol use: No  . Drug use: No   Social History   Social History Narrative   Lives alone in a one story home.  Has 3 children.  Retired Runner, broadcasting/film/video.  Education: college.    Right handed    Vital Signs:  BP (!) 156/70 (BP Location: Left Arm, Patient Position: Sitting, Cuff Size: Small)   Pulse 77   Ht 5\' 3"  (1.6 m)   Wt 152 lb 3.2 oz (69 kg)   SpO2 97%   BMI 26.96 kg/m    Neurological Exam: MENTAL STATUS including orientation to time, place, person, recent and remote memory, attention span and concentration, language, and fund of knowledge is normal.  Speech is not dysarthric.  CRANIAL NERVES: II:  No visual field defects.    III-IV-VI: Pupils equal round and reactive to light.  Normal conjugate, extra-ocular eye movements in all directions of gaze.  No nystagmus.  No ptosis.   V:  Normal facial sensation.    VII:  Normal facial symmetry and movements.   VIII:  Normal hearing and vestibular function.   IX-X:  Normal palatal movement.   XI:  Normal shoulder shrug and head rotation.   XII:  Normal tongue strength and range of motion, no deviation or fasciculation.  MOTOR:  No atrophy, fasciculations or abnormal movements.  No pronator drift.   Upper Extremity:  Right  Left  Deltoid  5/5   5/5   Biceps  5/5   5/5   Triceps  5/5   5/5   Infraspinatus 5/5  5/5  Medial pectoralis 5/5  5/5  Wrist extensors  5/5   5/5   Wrist flexors  5/5   5/5   Finger extensors  5/5   5/5   Finger flexors  5/5   5/5   Dorsal interossei  4/5   4/5   Abductor pollicis  5-/5   5-/5   Tone (Ashworth scale)  0  0   Lower Extremity:  Right  Left  Hip flexors  5/5   5/5   Hip extensors  5/5   5/5   Adductor 5/5   5/5  Abductor 5/5  5/5  Knee flexors  5/5   5/5   Knee extensors  5/5   5/5   Dorsiflexors  5/5   5/5   Plantarflexors  5/5   5/5   Toe extensors  5/5   5/5   Toe flexors  5/5   5/5   Tone (Ashworth scale)  0  0   MSRs:  Right        Left                  brachioradialis 2+  2+  biceps 2+  2+  triceps 2+  2+  patellar 2+  2+  ankle jerk 0  0  Hoffman no  no  plantar response down  down   SENSORY:  Vibration is reduced at the great toe bilaterally, intact at the ankles.  Temperature is reduced over the dorsum of the feet.  Sensation is normal in the hands. Tinel's sign is negative bilaterally.  COORDINATION/GAIT: Normal finger-to- nose-finger.  Intact rapid alternating movements bilaterally.  Severely antalgic gait due to left knee pain, assisted with cane.    IMPRESSION: 1.  Bilateral hand paresthesias, most suggestive of carpal tunnel syndrome  - NCS/EMG of the arms  - Start nighttime wrist braces  2.  Peripheral neuropathy of the feet manifesting with numbness, stable. Etiology - history of alcohol abuse, sober since 2018.   - Fall precautions discussed  3.  Left knee pain, acute  - Recommend that she see her PCP or urgent care  Further recommendations pending results.    Thank you for allowing me to participate in patient's care.  If I can answer any additional questions, I would be pleased to do so.    Sincerely,    Shameka Aggarwal K. Allena Katz, DO

## 2021-03-24 ENCOUNTER — Ambulatory Visit: Payer: Medicare Other | Admitting: Neurology

## 2021-03-24 ENCOUNTER — Other Ambulatory Visit: Payer: Self-pay

## 2021-03-24 DIAGNOSIS — G5603 Carpal tunnel syndrome, bilateral upper limbs: Secondary | ICD-10-CM | POA: Diagnosis not present

## 2021-03-24 DIAGNOSIS — G5623 Lesion of ulnar nerve, bilateral upper limbs: Secondary | ICD-10-CM

## 2021-03-24 NOTE — Procedures (Signed)
[facility]  [address] [place], [address]  [place], [place] [address] Tel: [phone] Fax:  [phone] Test Date:  03/24/2021  Patient: Marie Carpenter DOB: 1941-11-13 Physician: Nita Sickle, DO  Sex: Female Height: 5\' 3"  Ref Phys: , DO  ID#: Nita Sickle   Technician:    Patient Complaints: This is a 80 year old female referred for evaluation of bilateral hand paresthesias.  NCV & EMG Findings: Extensive electrodiagnostic testing of the right upper extremity and additional studies of the left shows:  1. Bilateral median sensory responses are absent.  Bilateral ulnar sensory responses show prolonged latency (R4.4, L4.6 ms). 2. Bilateral median motor responses show prolonged latency (L5.7, R5.0 ms) and reduced amplitude (L3.9, R1.3 mV).  Of note, there is evidence of bilateral Martin-Gruber anastomosis as seen by a greater proximal median amplitude and a motor response at the ulnar-wrist recording at the abductor pollicis brevis muscle.  Bilateral ulnar motor responses show slowed conduction velocity across the elbow (A Elbow-B Elbow, L45, R48 m/s).   3. Chronic motor axonal changes seen affecting bilateral abductor pollicis brevis muscles bilaterally, without accompanied active denervation.  Impression: 1. Bilateral median neuropathy at or distal to the wrist, consistent with a clinical diagnosis of carpal tunnel syndrome.  Overall, these findings are severe in degree electrically. 2. Bilateral ulnar neuropathy with slowing across the elbow, demyelinating, mild. 3. Incidentally, there is evidence of bilateral Martin-Gruber anastomosis, a normal anatomic variant. 4. There is no evidence of a sensorimotor polyneuropathy affecting the upper extremities.   ___________________________ 76, DO    Nerve Conduction Studies Anti Sensory Summary Table   Stim Site NR Peak (ms) Norm Peak (ms) P-T Amp (V) Norm P-T Amp  Left Median Anti Sensory (2nd Digit)  33C   Wrist NR  <3.8  >10  Right Median Anti Sensory (2nd Digit)  33C  Wrist NR  <3.8  >10  Left Ulnar Anti Sensory (5th Digit)  33C  Wrist    4.6 <3.2 12.9 >5  Right Ulnar Anti Sensory (5th Digit)  33C  Wrist    4.4 <3.2 13.3 >5   Motor Summary Table   Stim Site NR Onset (ms) Norm Onset (ms) O-P Amp (mV) Norm O-P Amp Site1 Site2 Delta-0 (ms) Dist (cm) Vel (m/s) Norm Vel (m/s)  Left Median Motor (Abd Poll Brev)  33C  Wrist    5.7 <4.0 3.9 >5 Elbow Wrist 3.7 27.0 73 >50  Elbow    9.4  3.7  Ulanr-wrist crossover Elbow 5.3 0.0    Ulanr-wrist crossover    4.1  4.4         Right Median Motor (Abd Poll Brev)  33C  Wrist    5.0 <4.0 1.3 >5 Elbow Wrist 4.8 26.0 54 >50  Elbow    9.8  1.2  Ulnar-wrist crossover Elbow 5.7 0.0    Ulnar-wrist crossover    4.1  6.1         Left Ulnar Motor (Abd Dig Minimi)  33C  Wrist    2.8 <3.1 8.7 >7 B Elbow Wrist 3.5 20.0 57 >50  B Elbow    6.3  7.8  A Elbow B Elbow 2.2 10.0 45 >50  A Elbow    8.5  6.7         Right Ulnar Motor (Abd Dig Minimi)  33C  Wrist    1.9 <3.1 7.3 >7 B Elbow Wrist 3.3 20.0 61 >50  B Elbow    5.2  5.7  A  Elbow B Elbow 2.1 10.0 48 >50  A Elbow    7.3  5.1          EMG   Side Muscle Ins Act Fibs Psw Fasc Number Recrt Dur Dur. Amp Amp. Poly Poly. Comment  Right 1stDorInt Nml Nml Nml Nml Nml Nml Nml Nml Nml Nml Nml Nml N/A  Right Abd Poll Brev Nml Nml Nml Nml 2- Rapid Many 1+ Many 1+ Many 1+ N/A  Right PronatorTeres Nml Nml Nml Nml Nml Nml Nml Nml Nml Nml Nml Nml N/A  Right Biceps Nml Nml Nml Nml Nml Nml Nml Nml Nml Nml Nml Nml N/A  Right Triceps Nml Nml Nml Nml Nml Nml Nml Nml Nml Nml Nml Nml N/A  Right Deltoid Nml Nml Nml Nml Nml Nml Nml Nml Nml Nml Nml Nml N/A  Right ABD Dig Min Nml Nml Nml Nml Nml Nml Nml Nml Nml Nml Nml Nml N/A  Right FlexCarpiUln Nml Nml Nml Nml Nml Nml Nml Nml Nml Nml Nml Nml N/A  Left PronatorTeres Nml Nml Nml Nml Nml Nml Nml Nml Nml Nml Nml Nml N/A  Left Biceps Nml Nml Nml Nml Nml Nml Nml Nml Nml Nml Nml  Nml N/A  Left Triceps Nml Nml Nml Nml Nml Nml Nml Nml Nml Nml Nml Nml N/A  Left Deltoid Nml Nml Nml Nml Nml Nml Nml Nml Nml Nml Nml Nml N/A  Left ABD Dig Min Nml Nml Nml Nml Nml Nml Nml Nml Nml Nml Nml Nml N/A  Left FlexCarpiUln Nml Nml Nml Nml Nml Nml Nml Nml Nml Nml Nml Nml N/A  Left 1stDorInt Nml Nml Nml Nml Nml Nml Nml Nml Nml Nml Nml Nml N/A  Left Abd Poll Brev Nml Nml Nml Nml 2- Rapid Many 1+ Many 1+ Many 1+ N/A      Waveforms:

## 2021-04-02 DIAGNOSIS — M1712 Unilateral primary osteoarthritis, left knee: Secondary | ICD-10-CM | POA: Diagnosis not present

## 2021-04-07 DIAGNOSIS — M25562 Pain in left knee: Secondary | ICD-10-CM | POA: Diagnosis not present

## 2021-04-21 DIAGNOSIS — M545 Low back pain, unspecified: Secondary | ICD-10-CM | POA: Diagnosis not present

## 2021-05-03 DIAGNOSIS — M5416 Radiculopathy, lumbar region: Secondary | ICD-10-CM | POA: Diagnosis not present

## 2021-05-03 DIAGNOSIS — M810 Age-related osteoporosis without current pathological fracture: Secondary | ICD-10-CM | POA: Diagnosis not present

## 2021-05-03 DIAGNOSIS — M25662 Stiffness of left knee, not elsewhere classified: Secondary | ICD-10-CM | POA: Diagnosis not present

## 2021-05-06 DIAGNOSIS — M25662 Stiffness of left knee, not elsewhere classified: Secondary | ICD-10-CM | POA: Diagnosis not present

## 2021-05-06 DIAGNOSIS — M5416 Radiculopathy, lumbar region: Secondary | ICD-10-CM | POA: Diagnosis not present

## 2021-05-14 DIAGNOSIS — M5416 Radiculopathy, lumbar region: Secondary | ICD-10-CM | POA: Diagnosis not present

## 2021-05-14 DIAGNOSIS — M25662 Stiffness of left knee, not elsewhere classified: Secondary | ICD-10-CM | POA: Diagnosis not present

## 2021-05-19 ENCOUNTER — Encounter: Payer: Medicare Other | Admitting: Neurology

## 2021-05-24 DIAGNOSIS — M25662 Stiffness of left knee, not elsewhere classified: Secondary | ICD-10-CM | POA: Diagnosis not present

## 2021-05-24 DIAGNOSIS — M5416 Radiculopathy, lumbar region: Secondary | ICD-10-CM | POA: Diagnosis not present

## 2021-05-26 DIAGNOSIS — M25562 Pain in left knee: Secondary | ICD-10-CM | POA: Diagnosis not present

## 2021-05-28 DIAGNOSIS — M5416 Radiculopathy, lumbar region: Secondary | ICD-10-CM | POA: Diagnosis not present

## 2021-05-28 DIAGNOSIS — M25662 Stiffness of left knee, not elsewhere classified: Secondary | ICD-10-CM | POA: Diagnosis not present

## 2021-06-04 DIAGNOSIS — M5416 Radiculopathy, lumbar region: Secondary | ICD-10-CM | POA: Diagnosis not present

## 2021-06-04 DIAGNOSIS — M25662 Stiffness of left knee, not elsewhere classified: Secondary | ICD-10-CM | POA: Diagnosis not present

## 2021-06-10 ENCOUNTER — Ambulatory Visit: Payer: Medicare Other | Admitting: Neurology

## 2021-06-10 DIAGNOSIS — H25813 Combined forms of age-related cataract, bilateral: Secondary | ICD-10-CM | POA: Diagnosis not present

## 2021-06-10 DIAGNOSIS — E119 Type 2 diabetes mellitus without complications: Secondary | ICD-10-CM | POA: Diagnosis not present

## 2021-06-11 DIAGNOSIS — M5416 Radiculopathy, lumbar region: Secondary | ICD-10-CM | POA: Diagnosis not present

## 2021-06-11 DIAGNOSIS — M25662 Stiffness of left knee, not elsewhere classified: Secondary | ICD-10-CM | POA: Diagnosis not present

## 2021-06-16 DIAGNOSIS — M25662 Stiffness of left knee, not elsewhere classified: Secondary | ICD-10-CM | POA: Diagnosis not present

## 2021-06-16 DIAGNOSIS — M5416 Radiculopathy, lumbar region: Secondary | ICD-10-CM | POA: Diagnosis not present

## 2021-06-21 DIAGNOSIS — M25562 Pain in left knee: Secondary | ICD-10-CM | POA: Diagnosis not present

## 2021-06-22 DIAGNOSIS — M5416 Radiculopathy, lumbar region: Secondary | ICD-10-CM | POA: Diagnosis not present

## 2021-06-22 DIAGNOSIS — M25662 Stiffness of left knee, not elsewhere classified: Secondary | ICD-10-CM | POA: Diagnosis not present

## 2021-06-23 DIAGNOSIS — R7309 Other abnormal glucose: Secondary | ICD-10-CM | POA: Diagnosis not present

## 2021-06-23 DIAGNOSIS — K259 Gastric ulcer, unspecified as acute or chronic, without hemorrhage or perforation: Secondary | ICD-10-CM | POA: Diagnosis not present

## 2021-06-23 DIAGNOSIS — M25562 Pain in left knee: Secondary | ICD-10-CM | POA: Diagnosis not present

## 2021-06-23 DIAGNOSIS — I10 Essential (primary) hypertension: Secondary | ICD-10-CM | POA: Diagnosis not present

## 2021-06-24 ENCOUNTER — Other Ambulatory Visit: Payer: Self-pay

## 2021-06-24 ENCOUNTER — Encounter: Payer: Self-pay | Admitting: Neurology

## 2021-06-24 ENCOUNTER — Ambulatory Visit: Payer: Medicare Other | Admitting: Neurology

## 2021-06-24 VITALS — BP 100/65 | HR 86 | Ht 63.0 in | Wt 145.0 lb

## 2021-06-24 DIAGNOSIS — G5603 Carpal tunnel syndrome, bilateral upper limbs: Secondary | ICD-10-CM | POA: Diagnosis not present

## 2021-06-24 DIAGNOSIS — G629 Polyneuropathy, unspecified: Secondary | ICD-10-CM | POA: Diagnosis not present

## 2021-06-24 DIAGNOSIS — G5623 Lesion of ulnar nerve, bilateral upper limbs: Secondary | ICD-10-CM | POA: Diagnosis not present

## 2021-06-24 NOTE — Progress Notes (Signed)
Follow-up Visit   Date: 06/24/21   Marie Carpenter MRN: [id number] DOB: 05/28/1941   Interim History: Marie Carpenter is a 80 y.o. right-handed Caucasian female with hypertension, peptic ulcer disease, and neuropathy returning to the clinic for follow-up of neuropathy and bilateral carpal tunnel syndrome.  The patient was accompanied to the clinic by self.  History of present illness: She has numbness/tingling of the hands for the past several years, but about 9 months ago, she began having worsening numbness in the thumb, index finger, and middle finger which is worse on the right.  She was recommended to use wrist brace by her PCP due to concern of carpal tunnel syndrome, and tried it for about a month.  She has difficulty with holding a pen, utensils, and opening jars/bottles.  She does not have neck pain or radicular arm pain.   She was seen in 2018 here for neuropathy in the feet.  This remains in the toes and continues to have numbness. She completed PT which helped her balance.  She walks unassisted. She has not suffered any falls. 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     For the past week, she has been having left medial knee pain, which is very sharp and worse with walking or standing. She has not seen her PCP for this yet.   UPDATE 06/24/2021:  She is here for follow-up visit.  Her EMG in April showed severe bilateral CTS and she tried wearing wrist braces, but it did not help.  Her balance is poor because of ongoing left knee pain and she is compliant with using her rigid walker at all times.  Neuropathy in the feet is stable without progression.  She is scheduled to have an MRI of the left knee next week.  She does not wish to proceed with any further evaluation with her CTS, until after her knee issues are resolved.   Medications:  Current Outpatient Medications on File Prior to Visit   Medication Sig Dispense Refill   cholecalciferol (VITAMIN D) 1000 units tablet Take 2,000 Units by mouth daily.      clobetasol ointment (TEMOVATE) 0.05 % Apply 1 application topically as needed.  0   denosumab (PROLIA) 60 MG/ML SOSY injection Inject 60 mLs into the skin every 6 (six) months.     lisinopril (ZESTRIL) 10 MG tablet Take 10 mg by mouth daily.  0   pantoprazole (PROTONIX) 40 MG tablet Take 1 tablet (40 mg total) by mouth 2 (two) times daily before a meal. Switch for any other PPI at similar dose and frequency (Patient taking differently: Take 40 mg by mouth daily. Switch for any other PPI at similar dose and frequency) 30 tablet 3   No current facility-administered medications on file prior to visit.    Allergies:  Allergies  Allergen Reactions   Hctz [Hydrochlorothiazide] Other (See Comments)    hyponatremia   Penicillins Other (See Comments) and Rash    Has patient had a PCN reaction causing immediate rash, facial/tongue/throat swelling, SOB or lightheadedness with hypotension: no Has patient had a PCN reaction causing severe rash involving mucus membranes or skin necrosis: no Has patient had a PCN reaction that required hospitalization no Has patient had a PCN reaction occurring within the last 10 years: yes 5 years ago If all of the above answers are "NO", then may proceed with Cephalosporin use.  Vital Signs:  BP 100/65 (BP Location: Left Arm, Patient Position: Sitting, Cuff Size: Normal)   Pulse 86   Ht 5\' 3"  (1.6 m)   Wt 145 lb (65.8 kg)   SpO2 96%   BMI 25.69 kg/m     Neurological Exam: MENTAL STATUS including orientation to time, place, person, recent and remote memory, attention span and concentration, language, and fund of knowledge is normal.  Speech is not dysarthric.  CRANIAL NERVES:  Pupils equal round and reactive to light.  Normal conjugate, extra-ocular eye movements in all directions of gaze.  No ptosis.    MOTOR:  Motor strength is 5/5 in all  extremities, except intrinsic hand muscles 4+/5; left knee flexion and extension limited by pain.  No atrophy, fasciculations or abnormal movements.  No pronator drift.  Tone is normal.    MSRs:  Reflexes are 2+/4 throughout, except absent Achilles bilaterally.  SENSORY:  Reduced vibration below the ankles, absent at the great toe.    COORDINATION/GAIT:  Normal finger-to- nose-finger.  Intact rapid alternating movements bilaterally.  Severely antalgic gait with reduced knee flexion/extension of the left knee due to pain, assisted with rigid walker.   Data: NCS/EMG of the arms 03/24/2021: Bilateral median neuropathy at or distal to the wrist, consistent with a clinical diagnosis of carpal tunnel syndrome.  Overall, these findings are severe in degree electrically. Bilateral ulnar neuropathy with slowing across the elbow, demyelinating, mild. Incidentally, there is evidence of bilateral Martin-Gruber anastomosis, a normal anatomic variant. There is no evidence of a sensorimotor polyneuropathy affecting the upper extremities.  IMPRESSION/PLAN: Bilateral carpal tunnel syndrome, severe - No improvement with wrist splints - Recommend seeing hand specialist for consideration of nerve decompressoin, she would like to hold off until her knee issues are resolved  Bilateral ulnar neuropathy across the elbow, mild and asymptomatic.   3.   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 History of prior alcohol abuse, sober since 2018.   - Fall precautions discussed, most of her imbalance currently is due to compensatory gait secondary to knee pain  4.   Left knee pain, followed by [facility].  This is her primary ongoing issue and she will be getting MRI of the left knee next week.  She would like to address this issue, prior to doing any additional work-up for her hands.   Return to clinic as needed   Thank you for allowing me to participate in patient's care.   If I can answer any additional questions, I would be pleased to do so.    Sincerely,    Rafael Quesada K. [facility], DO

## 2021-06-28 DIAGNOSIS — M25562 Pain in left knee: Secondary | ICD-10-CM | POA: Diagnosis not present

## 2021-07-05 DIAGNOSIS — M25562 Pain in left knee: Secondary | ICD-10-CM | POA: Diagnosis not present

## 2021-07-14 DIAGNOSIS — M7989 Other specified soft tissue disorders: Secondary | ICD-10-CM | POA: Diagnosis not present

## 2021-07-15 ENCOUNTER — Ambulatory Visit
Admission: RE | Admit: 2021-07-15 | Discharge: 2021-07-15 | Disposition: A | Discharge status: Home / Self Care | Payer: Medicare Other | Source: Ambulatory Visit | Attending: Family Medicine | Admitting: Family Medicine

## 2021-07-15 ENCOUNTER — Other Ambulatory Visit: Payer: Self-pay | Admitting: Family Medicine

## 2021-07-15 ENCOUNTER — Other Ambulatory Visit: Payer: Self-pay

## 2021-07-15 DIAGNOSIS — M7989 Other specified soft tissue disorders: Secondary | ICD-10-CM

## 2021-07-29 DIAGNOSIS — S83272A Complex tear of lateral meniscus, current injury, left knee, initial encounter: Secondary | ICD-10-CM | POA: Diagnosis not present

## 2021-07-29 DIAGNOSIS — S83232A Complex tear of medial meniscus, current injury, left knee, initial encounter: Secondary | ICD-10-CM | POA: Diagnosis not present

## 2021-07-29 DIAGNOSIS — M1712 Unilateral primary osteoarthritis, left knee: Secondary | ICD-10-CM | POA: Diagnosis not present

## 2021-07-29 DIAGNOSIS — G8918 Other acute postprocedural pain: Secondary | ICD-10-CM | POA: Diagnosis not present

## 2021-08-03 DIAGNOSIS — M2342 Loose body in knee, left knee: Secondary | ICD-10-CM | POA: Diagnosis not present

## 2021-08-03 DIAGNOSIS — M1712 Unilateral primary osteoarthritis, left knee: Secondary | ICD-10-CM | POA: Diagnosis not present

## 2021-08-04 DIAGNOSIS — M1712 Unilateral primary osteoarthritis, left knee: Secondary | ICD-10-CM | POA: Diagnosis not present

## 2021-08-04 DIAGNOSIS — M2342 Loose body in knee, left knee: Secondary | ICD-10-CM | POA: Diagnosis not present

## 2021-08-06 DIAGNOSIS — M1712 Unilateral primary osteoarthritis, left knee: Secondary | ICD-10-CM | POA: Diagnosis not present

## 2021-08-06 DIAGNOSIS — M2342 Loose body in knee, left knee: Secondary | ICD-10-CM | POA: Diagnosis not present

## 2021-08-09 DIAGNOSIS — M1712 Unilateral primary osteoarthritis, left knee: Secondary | ICD-10-CM | POA: Diagnosis not present

## 2021-08-09 DIAGNOSIS — M2342 Loose body in knee, left knee: Secondary | ICD-10-CM | POA: Diagnosis not present

## 2021-08-11 DIAGNOSIS — M1712 Unilateral primary osteoarthritis, left knee: Secondary | ICD-10-CM | POA: Diagnosis not present

## 2021-08-11 DIAGNOSIS — M2342 Loose body in knee, left knee: Secondary | ICD-10-CM | POA: Diagnosis not present

## 2021-08-13 DIAGNOSIS — M2342 Loose body in knee, left knee: Secondary | ICD-10-CM | POA: Diagnosis not present

## 2021-08-13 DIAGNOSIS — M1712 Unilateral primary osteoarthritis, left knee: Secondary | ICD-10-CM | POA: Diagnosis not present

## 2021-08-18 DIAGNOSIS — M2342 Loose body in knee, left knee: Secondary | ICD-10-CM | POA: Diagnosis not present

## 2021-08-18 DIAGNOSIS — M1712 Unilateral primary osteoarthritis, left knee: Secondary | ICD-10-CM | POA: Diagnosis not present

## 2021-08-20 DIAGNOSIS — M2342 Loose body in knee, left knee: Secondary | ICD-10-CM | POA: Diagnosis not present

## 2021-08-20 DIAGNOSIS — M1712 Unilateral primary osteoarthritis, left knee: Secondary | ICD-10-CM | POA: Diagnosis not present

## 2021-08-24 DIAGNOSIS — M1712 Unilateral primary osteoarthritis, left knee: Secondary | ICD-10-CM | POA: Diagnosis not present

## 2021-08-24 DIAGNOSIS — M2342 Loose body in knee, left knee: Secondary | ICD-10-CM | POA: Diagnosis not present

## 2021-08-26 DIAGNOSIS — M2342 Loose body in knee, left knee: Secondary | ICD-10-CM | POA: Diagnosis not present

## 2021-08-26 DIAGNOSIS — M1712 Unilateral primary osteoarthritis, left knee: Secondary | ICD-10-CM | POA: Diagnosis not present

## 2021-08-30 DIAGNOSIS — M1712 Unilateral primary osteoarthritis, left knee: Secondary | ICD-10-CM | POA: Diagnosis not present

## 2021-08-30 DIAGNOSIS — M2342 Loose body in knee, left knee: Secondary | ICD-10-CM | POA: Diagnosis not present

## 2021-09-03 DIAGNOSIS — H25811 Combined forms of age-related cataract, right eye: Secondary | ICD-10-CM | POA: Diagnosis not present

## 2021-09-03 DIAGNOSIS — H2511 Age-related nuclear cataract, right eye: Secondary | ICD-10-CM | POA: Diagnosis not present

## 2021-09-09 DIAGNOSIS — M2342 Loose body in knee, left knee: Secondary | ICD-10-CM | POA: Diagnosis not present

## 2021-09-09 DIAGNOSIS — M1712 Unilateral primary osteoarthritis, left knee: Secondary | ICD-10-CM | POA: Diagnosis not present

## 2021-09-14 DIAGNOSIS — M2342 Loose body in knee, left knee: Secondary | ICD-10-CM | POA: Diagnosis not present

## 2021-09-14 DIAGNOSIS — M1712 Unilateral primary osteoarthritis, left knee: Secondary | ICD-10-CM | POA: Diagnosis not present

## 2021-09-16 DIAGNOSIS — M2342 Loose body in knee, left knee: Secondary | ICD-10-CM | POA: Diagnosis not present

## 2021-09-16 DIAGNOSIS — G5602 Carpal tunnel syndrome, left upper limb: Secondary | ICD-10-CM | POA: Diagnosis not present

## 2021-09-16 DIAGNOSIS — M1712 Unilateral primary osteoarthritis, left knee: Secondary | ICD-10-CM | POA: Diagnosis not present

## 2021-09-16 DIAGNOSIS — G5601 Carpal tunnel syndrome, right upper limb: Secondary | ICD-10-CM | POA: Diagnosis not present

## 2021-09-20 DIAGNOSIS — M2342 Loose body in knee, left knee: Secondary | ICD-10-CM | POA: Diagnosis not present

## 2021-09-20 DIAGNOSIS — M1712 Unilateral primary osteoarthritis, left knee: Secondary | ICD-10-CM | POA: Diagnosis not present

## 2021-09-23 DIAGNOSIS — G5601 Carpal tunnel syndrome, right upper limb: Secondary | ICD-10-CM | POA: Diagnosis not present

## 2021-09-23 DIAGNOSIS — G5602 Carpal tunnel syndrome, left upper limb: Secondary | ICD-10-CM | POA: Diagnosis not present

## 2021-09-24 DIAGNOSIS — M2342 Loose body in knee, left knee: Secondary | ICD-10-CM | POA: Diagnosis not present

## 2021-09-24 DIAGNOSIS — M1712 Unilateral primary osteoarthritis, left knee: Secondary | ICD-10-CM | POA: Diagnosis not present

## 2021-09-28 DIAGNOSIS — M2342 Loose body in knee, left knee: Secondary | ICD-10-CM | POA: Diagnosis not present

## 2021-09-28 DIAGNOSIS — M1712 Unilateral primary osteoarthritis, left knee: Secondary | ICD-10-CM | POA: Diagnosis not present

## 2021-09-28 DIAGNOSIS — H2512 Age-related nuclear cataract, left eye: Secondary | ICD-10-CM | POA: Diagnosis not present

## 2021-09-29 DIAGNOSIS — Z9889 Other specified postprocedural states: Secondary | ICD-10-CM | POA: Diagnosis not present

## 2021-10-01 ENCOUNTER — Other Ambulatory Visit: Payer: Self-pay | Admitting: Orthopaedic Surgery

## 2021-10-01 DIAGNOSIS — H25812 Combined forms of age-related cataract, left eye: Secondary | ICD-10-CM | POA: Diagnosis not present

## 2021-10-01 DIAGNOSIS — H52222 Regular astigmatism, left eye: Secondary | ICD-10-CM | POA: Diagnosis not present

## 2021-10-01 DIAGNOSIS — H2512 Age-related nuclear cataract, left eye: Secondary | ICD-10-CM | POA: Diagnosis not present

## 2021-10-01 DIAGNOSIS — Z01818 Encounter for other preprocedural examination: Secondary | ICD-10-CM

## 2021-10-07 DIAGNOSIS — M1712 Unilateral primary osteoarthritis, left knee: Secondary | ICD-10-CM | POA: Diagnosis not present

## 2021-10-07 DIAGNOSIS — M2342 Loose body in knee, left knee: Secondary | ICD-10-CM | POA: Diagnosis not present

## 2021-10-21 NOTE — Patient Instructions (Signed)
DUE TO COVID-19 ONLY ONE VISITOR IS ALLOWED TO COME WITH YOU AND STAY IN THE WAITING ROOM ONLY DURING PRE OP AND PROCEDURE.   **NO VISITORS ARE ALLOWED IN THE SHORT STAY AREA OR RECOVERY ROOM!!**  IF YOU WILL BE ADMITTED INTO THE HOSPITAL YOU ARE ALLOWED ONLY TWO SUPPORT PEOPLE DURING VISITATION HOURS ONLY (7 AM -8PM)   The support person(s) must pass our screening, gel in and out, and wear a mask at all times, including in the patient's room. Patients must also wear a mask when staff or their support person are in the room. Visitors GUEST BADGE MUST BE WORN VISIBLY  One adult visitor may remain with you overnight and MUST be in the room by 8 P.M.  No visitors under the age of 52. Any visitor under the age of 52 must be accompanied by an adult.    Your procedure is scheduled on: 11/09/21   Report to [facility] Main Entrance    Report to admitting at: 7:15 AM   Call this number if you have problems the morning of surgery [phone]   Do not eat food : After Midnight.   May have liquids until : 7:00 AM   day of surgery  CLEAR LIQUID DIET  Foods Allowed                                                                     Foods Excluded  Water, Black Coffee and tea, regular and decaf                             liquids that you cannot  Plain Jell-O in any flavor  (No red)                                           see through such as: Fruit ices (not with fruit pulp)                                     milk, soups, orange juice              Iced Popsicles (No red)                                    All solid food                                   Apple juices Sports drinks like Gatorade (No red) Lightly seasoned clear broth or consume(fat free) Sugar  Sample Menu Breakfast                                Lunch  Supper Cranberry juice                    Beef broth                            Chicken broth Jell-O                                      Grape juice                           Apple juice Coffee or tea                        Jell-O                                      Popsicle                                                Coffee or tea                        Coffee or tea      Complete one Ensure drink the morning of surgery at : 7:00 AM      the day of surgery    The day of surgery:  Drink ONE (1) Pre-Surgery Clear Ensure or G2 by am the morning of surgery. Drink in one sitting. Do not sip.  This drink was given to you during your hospital  pre-op appointment visit. Nothing else to drink after completing the  Pre-Surgery Clear Ensure or G2.          If you have questions, please contact your surgeon's office.     Oral Hygiene is also important to reduce your risk of infection.                                    Remember - BRUSH YOUR TEETH THE MORNING OF SURGERY WITH YOUR REGULAR TOOTHPASTE   Do NOT smoke after Midnight   Take these medicines the morning of surgery with A SIP OF WATER: pantoprazole.Use eye drops as usual.  DO NOT TAKE ANY ORAL DIABETIC MEDICATIONS DAY OF YOUR SURGERY                              You may not have any metal on your body including hair pins, jewelry, and body piercing             Do not wear make-up, lotions, powders, perfumes/cologne, or deodorant  Do not wear nail polish including gel and S&S, artificial/acrylic nails, or any other type of covering on natural nails including finger and toenails. If you have artificial nails, gel coating, etc. that needs to be removed by a nail salon please have this removed prior to surgery or surgery may need to be canceled/ delayed if the surgeon/ anesthesia feels like they are unable to be safely monitored.  Do not shave  48 hours prior to surgery.    Do not bring valuables to the hospital. [facility] IS NOT             RESPONSIBLE   FOR VALUABLES.   Contacts, dentures or bridgework may not be worn into surgery.   Bring small  overnight bag day of surgery.    Patients discharged on the day of surgery will not be allowed to drive home.   Special Instructions: Bring a copy of your healthcare power of attorney and living will documents         the day of surgery if you haven't scanned them before.              Please read over the following fact sheets you were given: IF YOU HAVE QUESTIONS ABOUT YOUR PRE-OP INSTRUCTIONS PLEASE CALL [phone]     [facility] - Preparing for Surgery Before surgery, you can play an important role.  Because skin is not sterile, your skin needs to be as free of germs as possible.  You can reduce the number of germs on your skin by washing with CHG (chlorahexidine gluconate) soap before surgery.  CHG is an antiseptic cleaner which kills germs and bonds with the skin to continue killing germs even after washing. Please DO NOT use if you have an allergy to CHG or antibacterial soaps.  If your skin becomes reddened/irritated stop using the CHG and inform your nurse when you arrive at Short Stay. Do not shave (including legs and underarms) for at least 48 hours prior to the first CHG shower.  You may shave your face/neck. Please follow these instructions carefully:  1.  Shower with CHG Soap the night before surgery and the  morning of Surgery.  2.  If you choose to wash your hair, wash your hair first as usual with your  normal  shampoo.  3.  After you shampoo, rinse your hair and body thoroughly to remove the  shampoo.                           4.  Use CHG as you would any other liquid soap.  You can apply chg directly  to the skin and wash                       Gently with a scrungie or clean washcloth.  5.  Apply the CHG Soap to your body ONLY FROM THE NECK DOWN.   Do not use on face/ open                           Wound or open sores. Avoid contact with eyes, ears mouth and genitals (private parts).                       Wash face,  Genitals (private parts) with your normal soap.              6.  Wash thoroughly, paying special attention to the area where your surgery  will be performed.  7.  Thoroughly rinse your body with warm water from the neck down.  8.  DO NOT shower/wash with your normal soap after using and rinsing off  the CHG Soap.                9.  Pat yourself dry with  a clean towel.            10.  Wear clean pajamas.            11.  Place clean sheets on your bed the night of your first shower and do not  sleep with pets. Day of Surgery : Do not apply any lotions/deodorants the morning of surgery.  Please wear clean clothes to the hospital/surgery center.  FAILURE TO FOLLOW THESE INSTRUCTIONS MAY RESULT IN THE CANCELLATION OF YOUR SURGERY PATIENT SIGNATURE_________________________________  NURSE SIGNATURE__________________________________  ________________________________________________________________________   Rogelia Mire  An incentive spirometer is a tool that can help keep your lungs clear and active. This tool measures how well you are filling your lungs with each breath. Taking long deep breaths may help reverse or decrease the chance of developing breathing (pulmonary) problems (especially infection) following: A long period of time when you are unable to move or be active. BEFORE THE PROCEDURE  If the spirometer includes an indicator to show your best effort, your nurse or respiratory therapist will set it to a desired goal. If possible, sit up straight or lean slightly forward. Try not to slouch. Hold the incentive spirometer in an upright position. INSTRUCTIONS FOR USE  Sit on the edge of your bed if possible, or sit up as far as you can in bed or on a chair. Hold the incentive spirometer in an upright position. Breathe out normally. Place the mouthpiece in your mouth and seal your lips tightly around it. Breathe in slowly and as deeply as possible, raising the piston or the ball toward the top of the column. Hold your breath for 3-5  seconds or for as long as possible. Allow the piston or ball to fall to the bottom of the column. Remove the mouthpiece from your mouth and breathe out normally. Rest for a few seconds and repeat Steps 1 through 7 at least 10 times every 1-2 hours when you are awake. Take your time and take a few normal breaths between deep breaths. The spirometer may include an indicator to show your best effort. Use the indicator as a goal to work toward during each repetition. After each set of 10 deep breaths, practice coughing to be sure your lungs are clear. If you have an incision (the cut made at the time of surgery), support your incision when coughing by placing a pillow or rolled up towels firmly against it. Once you are able to get out of bed, walk around indoors and cough well. You may stop using the incentive spirometer when instructed by your caregiver.  RISKS AND COMPLICATIONS Take your time so you do not get dizzy or light-headed. If you are in pain, you may need to take or ask for pain medication before doing incentive spirometry. It is harder to take a deep breath if you are having pain. AFTER USE Rest and breathe slowly and easily. It can be helpful to keep track of a log of your progress. Your caregiver can provide you with a simple table to help with this. If you are using the spirometer at home, follow these instructions: SEEK MEDICAL CARE IF:  You are having difficultly using the spirometer. You have trouble using the spirometer as often as instructed. Your pain medication is not giving enough relief while using the spirometer. You develop fever of 100.5 F (38.1 C) or higher. SEEK IMMEDIATE MEDICAL CARE IF:  You cough up bloody sputum that had not been present before. You develop fever  of 102 F (38.9 C) or greater. You develop worsening pain at or near the incision site. MAKE SURE YOU:  Understand these instructions. Will watch your condition. Will get help right away if you are  not doing well or get worse. Document Released: 04/10/2007 Document Revised: 02/20/2012 Document Reviewed: 06/11/2007 [facility] Patient Information 2014 [place], [place].   ________________________________________________________________________

## 2021-10-26 ENCOUNTER — Ambulatory Visit (HOSPITAL_COMMUNITY)
Admission: RE | Admit: 2021-10-26 | Discharge: 2021-10-26 | Disposition: A | Discharge status: Home / Self Care | Payer: Medicare Other | Source: Ambulatory Visit | Attending: Orthopaedic Surgery | Admitting: Orthopaedic Surgery

## 2021-10-26 ENCOUNTER — Other Ambulatory Visit: Payer: Self-pay

## 2021-10-26 ENCOUNTER — Encounter (HOSPITAL_COMMUNITY): Payer: Self-pay

## 2021-10-26 ENCOUNTER — Encounter (HOSPITAL_COMMUNITY)
Admission: RE | Admit: 2021-10-26 | Discharge: 2021-10-26 | Disposition: A | Discharge status: Home / Self Care | Payer: Medicare Other | Source: Ambulatory Visit | Attending: Orthopaedic Surgery | Admitting: Orthopaedic Surgery

## 2021-10-26 VITALS — BP 150/76 | HR 68 | Temp 98.0°F | Resp 18 | Ht 63.0 in | Wt 175.0 lb

## 2021-10-26 DIAGNOSIS — Z01818 Encounter for other preprocedural examination: Secondary | ICD-10-CM | POA: Insufficient documentation

## 2021-10-26 DIAGNOSIS — R7303 Prediabetes: Secondary | ICD-10-CM | POA: Insufficient documentation

## 2021-10-26 HISTORY — DX: Unspecified osteoarthritis, unspecified site: M19.90

## 2021-10-26 HISTORY — DX: Prediabetes: R73.03

## 2021-10-26 LAB — TYPE AND SCREEN
ABO/RH(D): O NEG
Antibody Screen: NEGATIVE

## 2021-10-26 LAB — BASIC METABOLIC PANEL
Anion gap: 6 (ref 5–15)
BUN: 20 mg/dL (ref 8–23)
CO2: 31 mmol/L (ref 22–32)
Calcium: 9.2 mg/dL (ref 8.9–10.3)
Chloride: 101 mmol/L (ref 98–111)
Creatinine, Ser: 0.57 mg/dL (ref 0.44–1.00)
GFR, Estimated: >60 mL/min (ref >60)
Glucose, Bld: 90 mg/dL (ref 70–99)
Potassium: 4.2 mmol/L (ref 3.5–5.1)
Sodium: 138 mmol/L (ref 135–145)

## 2021-10-26 LAB — APTT: aPTT: 30 seconds (ref 24–36)

## 2021-10-26 LAB — URINALYSIS, ROUTINE W REFLEX MICROSCOPIC
Bacteria, UA: NONE SEEN
Bilirubin Urine: NEGATIVE
Glucose, UA: NEGATIVE mg/dL
Hgb urine dipstick: NEGATIVE
Ketones, ur: NEGATIVE mg/dL
Nitrite: NEGATIVE
Protein, ur: NEGATIVE mg/dL
Specific Gravity, Urine: 1.014 (ref 1.005–1.030)
pH: 6 (ref 5.0–8.0)

## 2021-10-26 LAB — CBC WITH DIFFERENTIAL/PLATELET
Abs Immature Granulocytes: 0.01 10*3/uL (ref 0.00–0.07)
Basophils Absolute: 0.1 10*3/uL (ref 0.0–0.1)
Basophils Relative: 1 %
Eosinophils Absolute: 0.1 10*3/uL (ref 0.0–0.5)
Eosinophils Relative: 2 %
HCT: 40.2 % (ref 36.0–46.0)
Hemoglobin: 12.8 g/dL (ref 12.0–15.0)
Immature Granulocytes: 0 %
Lymphocytes Relative: 25 %
Lymphs Abs: 1.7 10*3/uL (ref 0.7–4.0)
MCH: 30 pg (ref 26.0–34.0)
MCHC: 31.8 g/dL (ref 30.0–36.0)
MCV: 94.4 fL (ref 80.0–100.0)
Monocytes Absolute: 0.7 10*3/uL (ref 0.1–1.0)
Monocytes Relative: 10 %
Neutro Abs: 4.3 10*3/uL (ref 1.7–7.7)
Neutrophils Relative %: 62 %
Platelets: 290 10*3/uL (ref 150–400)
RBC: 4.26 MIL/uL (ref 3.87–5.11)
RDW: 13.7 % (ref 11.5–15.5)
WBC: 6.9 10*3/uL (ref 4.0–10.5)
nRBC: 0 % (ref 0.0–0.2)

## 2021-10-26 LAB — HEMOGLOBIN A1C
Hgb A1c MFr Bld: 5.6 % (ref 4.8–5.6)
Mean Plasma Glucose: 114.02 mg/dL

## 2021-10-26 LAB — PROTIME-INR
INR: 0.9 (ref 0.8–1.2)
Prothrombin Time: 12.3 seconds (ref 11.4–15.2)

## 2021-10-26 LAB — SURGICAL PCR SCREEN
MRSA, PCR: NEGATIVE
Staphylococcus aureus: NEGATIVE

## 2021-10-26 LAB — GLUCOSE, CAPILLARY: Glucose-Capillary: 85 mg/dL (ref 70–99)

## 2021-10-26 NOTE — Progress Notes (Signed)
COVID Vaccine Completed: Yes Date COVID Vaccine completed: 2021. X 3 COVID vaccine manufacturer: Pfizer    COVID Test: N/A  PCP - Dr. Laurann Montana. Cardiologist -   Chest x-ray -  EKG -  Stress Test -  ECHO -  Cardiac Cath -  Pacemaker/ICD device last checked:  Sleep Study -  CPAP -   Fasting Blood Sugar -  Checks Blood Sugar _____ times a day  Blood Thinner Instructions: Aspirin Instructions: Last Dose:  Anesthesia review: Hx: HTN  Patient denies shortness of breath, fever, cough and chest pain at PAT appointment   Patient verbalized understanding of instructions that were given to them at the PAT appointment. Patient was also instructed that they will need to review over the PAT instructions again at home before surgery.

## 2021-10-26 NOTE — Progress Notes (Signed)
UA results: Trace leukocytes. 

## 2021-10-27 NOTE — Progress Notes (Signed)
Hgba1c-5.6 on 10/26/21.

## 2021-10-29 DIAGNOSIS — G5601 Carpal tunnel syndrome, right upper limb: Secondary | ICD-10-CM | POA: Diagnosis not present

## 2021-11-02 NOTE — Care Plan (Signed)
Ortho Bundle Case Management Note  Patient Details  Name: Marie Carpenter MRN: [id number] Date of Birth: [birth year]  Spoke with patient prior to surgery. SHe will discharge to home with family to assist. She has a rolling walker at home. HHPT referral to [facility] and OPPT set up with [facility] [address]. Patient and MD in agreement with plan. Choice offered                      DME Arranged:    DME Agency:     HH Arranged:  PT HH Agency:  [facility]  Additional Comments: Please contact me with any questions of if this plan should need to change.  Shauna Hugh,  RN,BSN,MHA,CCM  [facility] Orthopaedic Specialist  [phone] 11/02/2021, 9:10 AM

## 2021-11-08 NOTE — H&P (Signed)
TOTAL KNEE ADMISSION H&P  Patient is being admitted for left total knee arthroplasty.  Subjective:  Chief Complaint:left knee pain.  HPI: Marie Carpenter, 80 y.o. female, has a history of pain and functional disability in the left knee due to arthritis and has failed non-surgical conservative treatments for greater than 12 weeks to includeNSAID's and/or analgesics, corticosteriod injections, flexibility and strengthening excercises, supervised PT with diminished ADL's post treatment, use of assistive devices, weight reduction as appropriate, and activity modification.  Onset of symptoms was gradual, starting 5 years ago with gradually worsening course since that time. The patient noted prior procedures on the knee to include  arthroscopy on the left knee(s).  Patient currently rates pain in the left knee(s) at 10 out of 10 with activity. Patient has night pain, worsening of pain with activity and weight bearing, pain that interferes with activities of daily living, crepitus, and joint swelling.  Patient has evidence of subchondral cysts, subchondral sclerosis, periarticular osteophytes, and joint space narrowing by imaging studies. There is no active infection.  Patient Active Problem List   Diagnosis Date Noted   Upper GI bleed 12/26/2016   Sepsis, unspecified organism (HCC) 12/26/2016   Colitis 12/26/2016   Hiatal hernia 02/07/2016   PUD (peptic ulcer disease) 02/07/2016   Barrett's esophagus 02/07/2016   Osteoporosis 02/06/2016   Essential hypertension 02/06/2016   Acute blood loss anemia 02/06/2016   Past Medical History:  Diagnosis Date   Arthritis    Barrett's esophagus 02/07/2016   Essential hypertension 02/06/2016   Hiatal hernia 02/07/2016   Hypertension    Osteoporosis 02/06/2016   Osteoporosis    Pre-diabetes    PUD (peptic ulcer disease) 02/07/2016   UGI bleed 02/06/2016    Past Surgical History:  Procedure Laterality Date   CATARACT EXTRACTION W/ INTRAOCULAR LENS  IMPLANT Bilateral    CHOLECYSTECTOMY     ESOPHAGOGASTRODUODENOSCOPY Left 02/07/2016   Procedure: ESOPHAGOGASTRODUODENOSCOPY (EGD);  Surgeon: Dorena Cookey, MD;  Location: [facility];  Service: Endoscopy;  Laterality: Left;   ESOPHAGOGASTRODUODENOSCOPY N/A 12/27/2016   Procedure: ESOPHAGOGASTRODUODENOSCOPY (EGD);  Surgeon: Graylin Shiver, MD;  Location: [facility] END[facility]COPY;  Service: Endoscopy;  Laterality: N/A;   fracture Left    arm   TONSILLECTOMY      No current facility-administered medications for this encounter.   Current Outpatient Medications  Medication Sig Dispense Refill Last Dose   acetaminophen (TYLENOL) 650 MG CR tablet Take 1,300 mg by mouth every 8 (eight) hours as needed for pain.      Cholecalciferol (VITAMIN D) 50 MCG (2000 UT) tablet Take 4,000 Units by mouth daily.      clobetasol ointment (TEMOVATE) 0.05 % Apply 1 application topically as needed (rash).  0    denosumab (PROLIA) 60 MG/ML SOSY injection Inject 60 mLs into the skin every 6 (six) months.      lisinopril (ZESTRIL) 10 MG tablet Take 10 mg by mouth daily.  0    pantoprazole (PROTONIX) 40 MG tablet Take 1 tablet (40 mg total) by mouth 2 (two) times daily before a meal. Switch for any other PPI at similar dose and frequency (Patient taking differently: Take 40 mg by mouth daily. Switch for any other PPI at similar dose and frequency) 30 tablet 3    Prednisol Ace-Moxiflox-Bromfen 1-0.5-0.075 % SUSP Place 1 drop into the left eye daily.      Allergies  Allergen Reactions   Hctz [Hydrochlorothiazide] Other (See Comments)    hyponatremia   Penicillins Other (See Comments) and  Rash    Has patient had a PCN reaction causing immediate rash, facial/tongue/throat swelling, SOB or lightheadedness with hypotension: no Has patient had a PCN reaction causing severe rash involving mucus membranes or skin necrosis: no Has patient had a PCN reaction that required hospitalization no Has patient had a PCN reaction occurring within  the last 10 years: yes 5 years ago If all of the above answers are "NO", then may proceed with Cephalosporin use.    Social History   Tobacco Use   Smoking status: Former   Smokeless tobacco: Never  Substance Use Topics   Alcohol use: Not Currently    Family History  Problem Relation Age of Onset   Heart disease Father    Alzheimer's disease Mother    Other Brother    Diabetes Maternal Grandfather      Review of Systems  Musculoskeletal:  Positive for arthralgias.       Left knee    All other systems reviewed and are negative.  Objective:  Physical Exam Constitutional:      Appearance: Normal appearance.  HENT:     Head: Normocephalic and atraumatic.     Nose: Nose normal.     Mouth/Throat:     Pharynx: Oropharynx is clear.  Eyes:     Extraocular Movements: Extraocular movements intact.  Cardiovascular:     Rate and Rhythm: Normal rate and regular rhythm.  Pulmonary:     Effort: Pulmonary effort is normal.  Abdominal:     Palpations: Abdomen is soft.  Musculoskeletal:     Cervical back: Normal range of motion.     Comments: Left knee exhibits a moderate valgus deformity.  On the right she has a mild valgus deformity.  There is trace effusion on the left.  Her motion is about 10-100.  Calf is soft and nontender.    Skin:    General: Skin is warm and dry.  Neurological:     General: No focal deficit present.     Mental Status: She is alert and oriented to person, place, and time.  Psychiatric:        Mood and Affect: Mood normal.        Behavior: Behavior normal.        Thought Content: Thought content normal.        Judgment: Judgment normal.    Vital signs in last 24 hours:    Labs:   Estimated body mass index is 31 kg/m as calculated from the following:   Height as of 10/26/21: 5\' 3"  (1.6 m).   Weight as of 10/26/21: 79.4 kg.   Imaging Review Plain radiographs demonstrate severe degenerative joint disease of the left knee(s). The overall  alignment isneutral. The bone quality appears to be good for age and reported activity level.      Assessment/Plan:  End stage primary arthritis, left knee   The patient history, physical examination, clinical judgment of the provider and imaging studies are consistent with end stage degenerative joint disease of the left knee(s) and total knee arthroplasty is deemed medically necessary. The treatment options including medical management, injection therapy arthroscopy and arthroplasty were discussed at length. The risks and benefits of total knee arthroplasty were presented and reviewed. The risks due to aseptic loosening, infection, stiffness, patella tracking problems, thromboembolic complications and other imponderables were discussed. The patient acknowledged the explanation, agreed to proceed with the plan and consent was signed. Patient is being admitted for inpatient treatment for surgery, pain control, PT,  OT, prophylactic antibiotics, VTE prophylaxis, progressive ambulation and ADL's and discharge planning. The patient is planning to be discharged home with home health services     Patient's anticipated LOS is less than 2 midnights, meeting these requirements: - Younger than 42 - Lives within 1 hour of care - Has a competent adult at home to recover with post-op recover - NO history of  - Chronic pain requiring opiods  - Diabetes  - Coronary Artery Disease  - Heart failure  - Heart attack  - Stroke  - DVT/VTE  - Cardiac arrhythmia  - Respiratory Failure/COPD  - Renal failure  - Anemia  - Advanced Liver disease

## 2021-11-09 ENCOUNTER — Ambulatory Visit (HOSPITAL_COMMUNITY)
Admission: RE | Admit: 2021-11-09 | Discharge: 2021-11-09 | Disposition: A | Discharge status: Home / Self Care | Payer: Medicare Other | Attending: Orthopaedic Surgery | Admitting: Orthopaedic Surgery

## 2021-11-09 ENCOUNTER — Other Ambulatory Visit: Payer: Self-pay

## 2021-11-09 ENCOUNTER — Encounter (HOSPITAL_COMMUNITY)
Admission: RE | Disposition: A | Discharge status: Home / Self Care | Payer: Self-pay | Source: Home / Self Care | Attending: Orthopaedic Surgery

## 2021-11-09 ENCOUNTER — Ambulatory Visit (HOSPITAL_COMMUNITY): Payer: Medicare Other | Admitting: Certified Registered Nurse Anesthetist

## 2021-11-09 ENCOUNTER — Encounter (HOSPITAL_COMMUNITY): Payer: Self-pay | Admitting: Orthopaedic Surgery

## 2021-11-09 DIAGNOSIS — Z131 Encounter for screening for diabetes mellitus: Secondary | ICD-10-CM | POA: Insufficient documentation

## 2021-11-09 DIAGNOSIS — K449 Diaphragmatic hernia without obstruction or gangrene: Secondary | ICD-10-CM | POA: Insufficient documentation

## 2021-11-09 DIAGNOSIS — M1712 Unilateral primary osteoarthritis, left knee: Secondary | ICD-10-CM | POA: Insufficient documentation

## 2021-11-09 DIAGNOSIS — G8918 Other acute postprocedural pain: Secondary | ICD-10-CM | POA: Diagnosis not present

## 2021-11-09 DIAGNOSIS — Z8711 Personal history of peptic ulcer disease: Secondary | ICD-10-CM | POA: Insufficient documentation

## 2021-11-09 DIAGNOSIS — Z87891 Personal history of nicotine dependence: Secondary | ICD-10-CM | POA: Insufficient documentation

## 2021-11-09 DIAGNOSIS — I10 Essential (primary) hypertension: Secondary | ICD-10-CM | POA: Diagnosis not present

## 2021-11-09 DIAGNOSIS — Z96652 Presence of left artificial knee joint: Secondary | ICD-10-CM | POA: Diagnosis not present

## 2021-11-09 HISTORY — PX: TOTAL KNEE ARTHROPLASTY: SHX125

## 2021-11-09 LAB — GLUCOSE, CAPILLARY: Glucose-Capillary: 162 mg/dL — ABNORMAL HIGH (ref 70–99)

## 2021-11-09 SURGERY — ARTHROPLASTY, KNEE, TOTAL
Anesthesia: Spinal | Site: Knee | Laterality: Left

## 2021-11-09 MED ORDER — HYDROMORPHONE HCL 1 MG/ML IJ SOLN: 0.2500 mg | INTRAMUSCULAR | Status: DC | PRN

## 2021-11-09 MED ORDER — BUPIVACAINE LIPOSOME 1.3 % IJ SUSP: 20.0000 mL | Freq: Once | INTRAMUSCULAR | Status: DC

## 2021-11-09 MED ORDER — 0.9 % SODIUM CHLORIDE (POUR BTL) OPTIME
TOPICAL | Status: DC | PRN
  Administered 2021-11-09: 1000 mL

## 2021-11-09 MED ORDER — ONDANSETRON HCL 4 MG/2ML IJ SOLN
INTRAMUSCULAR | Status: AC
  Filled 2021-11-09: qty 2

## 2021-11-09 MED ORDER — BUPIVACAINE IN DEXTROSE 0.75-8.25 % IT SOLN
INTRATHECAL | Status: DC | PRN
  Administered 2021-11-09: 1.4 mL via INTRATHECAL

## 2021-11-09 MED ORDER — PHENYLEPHRINE HCL-NACL 20-0.9 MG/250ML-% IV SOLN
INTRAVENOUS | Status: DC | PRN
  Administered 2021-11-09: 20 ug/min via INTRAVENOUS

## 2021-11-09 MED ORDER — VANCOMYCIN HCL IN DEXTROSE 1-5 GM/200ML-% IV SOLN
INTRAVENOUS | Status: AC
  Filled 2021-11-09: qty 200

## 2021-11-09 MED ORDER — FENTANYL CITRATE PF 50 MCG/ML IJ SOSY
50.0000 ug | PREFILLED_SYRINGE | INTRAMUSCULAR | Status: DC
  Administered 2021-11-09: 50 ug via INTRAVENOUS

## 2021-11-09 MED ORDER — PROPOFOL 1000 MG/100ML IV EMUL
INTRAVENOUS | Status: AC
  Filled 2021-11-09: qty 100

## 2021-11-09 MED ORDER — LACTATED RINGERS IV BOLUS
250.0000 mL | Freq: Once | INTRAVENOUS | Status: AC
  Administered 2021-11-09: 250 mL via INTRAVENOUS

## 2021-11-09 MED ORDER — MIDAZOLAM HCL 2 MG/2ML IJ SOLN
INTRAMUSCULAR | Status: AC
  Filled 2021-11-09: qty 2

## 2021-11-09 MED ORDER — TRANEXAMIC ACID-NACL 1000-0.7 MG/100ML-% IV SOLN: 1000.0000 mg | Freq: Once | INTRAVENOUS | Status: DC

## 2021-11-09 MED ORDER — LACTATED RINGERS IV BOLUS
500.0000 mL | Freq: Once | INTRAVENOUS | Status: AC
  Administered 2021-11-09: 500 mL via INTRAVENOUS

## 2021-11-09 MED ORDER — FENTANYL CITRATE PF 50 MCG/ML IJ SOSY
PREFILLED_SYRINGE | INTRAMUSCULAR | Status: AC
  Filled 2021-11-09: qty 2

## 2021-11-09 MED ORDER — DEXAMETHASONE SODIUM PHOSPHATE 10 MG/ML IJ SOLN
INTRAMUSCULAR | Status: DC | PRN
  Administered 2021-11-09: 8 mg via INTRAVENOUS

## 2021-11-09 MED ORDER — EPHEDRINE SULFATE-NACL 50-0.9 MG/10ML-% IV SOSY
PREFILLED_SYRINGE | INTRAVENOUS | Status: DC | PRN
  Administered 2021-11-09: 7.5 mg via INTRAVENOUS
  Administered 2021-11-09: 5 mg via INTRAVENOUS
  Administered 2021-11-09: 7.5 mg via INTRAVENOUS

## 2021-11-09 MED ORDER — BUPIVACAINE LIPOSOME 1.3 % IJ SUSP
INTRAMUSCULAR | Status: AC
  Filled 2021-11-09: qty 20

## 2021-11-09 MED ORDER — STERILE WATER FOR IRRIGATION IR SOLN
Status: DC | PRN
  Administered 2021-11-09: 2000 mL

## 2021-11-09 MED ORDER — CEFAZOLIN SODIUM-DEXTROSE 2-4 GM/100ML-% IV SOLN: 2.0000 g | Freq: Four times a day (QID) | INTRAVENOUS | Status: DC

## 2021-11-09 MED ORDER — TIZANIDINE HCL 4 MG PO TABS: 4.0000 mg | ORAL_TABLET | Freq: Four times a day (QID) | ORAL | 1 refills | Status: AC | PRN

## 2021-11-09 MED ORDER — EPHEDRINE 5 MG/ML INJ
INTRAVENOUS | Status: AC
  Filled 2021-11-09: qty 5

## 2021-11-09 MED ORDER — SODIUM CHLORIDE (PF) 0.9 % IJ SOLN
INTRAMUSCULAR | Status: AC
  Filled 2021-11-09: qty 30

## 2021-11-09 MED ORDER — LACTATED RINGERS IV SOLN: INTRAVENOUS | Status: DC

## 2021-11-09 MED ORDER — SODIUM CHLORIDE 0.9 % IR SOLN
Status: DC | PRN
  Administered 2021-11-09: 3000 mL

## 2021-11-09 MED ORDER — HYDROCODONE-ACETAMINOPHEN 5-325 MG PO TABS: 1.0000 | ORAL_TABLET | Freq: Four times a day (QID) | ORAL | 0 refills | Status: AC | PRN

## 2021-11-09 MED ORDER — MIDAZOLAM HCL 2 MG/2ML IJ SOLN
1.0000 mg | INTRAMUSCULAR | Status: DC
Start: 2021-11-09 — End: 2021-11-09

## 2021-11-09 MED ORDER — DEXAMETHASONE SODIUM PHOSPHATE 10 MG/ML IJ SOLN
INTRAMUSCULAR | Status: AC
  Filled 2021-11-09: qty 1

## 2021-11-09 MED ORDER — ORAL CARE MOUTH RINSE: 15.0000 mL | Freq: Once | OROMUCOSAL | Status: AC

## 2021-11-09 MED ORDER — POVIDONE-IODINE 10 % EX SWAB: 2.0000 "application " | Freq: Once | CUTANEOUS | Status: DC

## 2021-11-09 MED ORDER — METHOCARBAMOL 500 MG IVPB - SIMPLE MED: 500.0000 mg | Freq: Four times a day (QID) | INTRAVENOUS | Status: DC | PRN

## 2021-11-09 MED ORDER — SODIUM CHLORIDE (PF) 0.9 % IJ SOLN
INTRAMUSCULAR | Status: DC | PRN
  Administered 2021-11-09: 30 mL

## 2021-11-09 MED ORDER — PROPOFOL 10 MG/ML IV BOLUS
INTRAVENOUS | Status: DC | PRN
  Administered 2021-11-09: 20 mg via INTRAVENOUS

## 2021-11-09 MED ORDER — ONDANSETRON HCL 4 MG/2ML IJ SOLN: 4.0000 mg | Freq: Once | INTRAMUSCULAR | Status: DC | PRN

## 2021-11-09 MED ORDER — OXYCODONE HCL 5 MG/5ML PO SOLN: 5.0000 mg | Freq: Once | ORAL | Status: DC | PRN

## 2021-11-09 MED ORDER — TRANEXAMIC ACID 1000 MG/10ML IV SOLN
2000.0000 mg | INTRAVENOUS | Status: DC
  Filled 2021-11-09: qty 20

## 2021-11-09 MED ORDER — BUPIVACAINE-EPINEPHRINE 0.5% -1:200000 IJ SOLN
INTRAMUSCULAR | Status: DC | PRN
  Administered 2021-11-09: 30 mL

## 2021-11-09 MED ORDER — CEFAZOLIN SODIUM-DEXTROSE 2-3 GM-%(50ML) IV SOLR
INTRAVENOUS | Status: DC | PRN
  Administered 2021-11-09: 2 g via INTRAVENOUS

## 2021-11-09 MED ORDER — PROPOFOL 500 MG/50ML IV EMUL
INTRAVENOUS | Status: DC | PRN
  Administered 2021-11-09: 50 ug/kg/min via INTRAVENOUS

## 2021-11-09 MED ORDER — METHOCARBAMOL 500 MG PO TABS: 500.0000 mg | ORAL_TABLET | Freq: Four times a day (QID) | ORAL | Status: DC | PRN

## 2021-11-09 MED ORDER — ONDANSETRON HCL 4 MG/2ML IJ SOLN
INTRAMUSCULAR | Status: DC | PRN
  Administered 2021-11-09: 4 mg via INTRAVENOUS

## 2021-11-09 MED ORDER — CHLORHEXIDINE GLUCONATE 0.12 % MT SOLN
15.0000 mL | Freq: Once | OROMUCOSAL | Status: AC
  Administered 2021-11-09: 15 mL via OROMUCOSAL

## 2021-11-09 MED ORDER — TRANEXAMIC ACID-NACL 1000-0.7 MG/100ML-% IV SOLN
1000.0000 mg | INTRAVENOUS | Status: AC
  Administered 2021-11-09: 1000 mg via INTRAVENOUS

## 2021-11-09 MED ORDER — BUPIVACAINE LIPOSOME 1.3 % IJ SUSP
INTRAMUSCULAR | Status: DC | PRN
  Administered 2021-11-09: 20 mL

## 2021-11-09 MED ORDER — TRANEXAMIC ACID-NACL 1000-0.7 MG/100ML-% IV SOLN
INTRAVENOUS | Status: AC
  Filled 2021-11-09: qty 100

## 2021-11-09 MED ORDER — CEFAZOLIN SODIUM-DEXTROSE 2-4 GM/100ML-% IV SOLN
INTRAVENOUS | Status: AC
  Filled 2021-11-09: qty 100

## 2021-11-09 MED ORDER — KETOROLAC TROMETHAMINE 15 MG/ML IJ SOLN: 7.5000 mg | Freq: Four times a day (QID) | INTRAMUSCULAR | Status: DC

## 2021-11-09 MED ORDER — TRANEXAMIC ACID 1000 MG/10ML IV SOLN
INTRAVENOUS | Status: DC | PRN
  Administered 2021-11-09: 2000 mg via TOPICAL

## 2021-11-09 MED ORDER — VANCOMYCIN HCL IN DEXTROSE 1-5 GM/200ML-% IV SOLN
1000.0000 mg | INTRAVENOUS | Status: AC
  Administered 2021-11-09: 1000 mg via INTRAVENOUS

## 2021-11-09 MED ORDER — HYDROCODONE-ACETAMINOPHEN 7.5-325 MG PO TABS
1.0000 | ORAL_TABLET | ORAL | Status: DC | PRN
  Administered 2021-11-09: 2 via ORAL

## 2021-11-09 MED ORDER — ACETAMINOPHEN 10 MG/ML IV SOLN: 1000.0000 mg | Freq: Once | INTRAVENOUS | Status: DC | PRN

## 2021-11-09 MED ORDER — ROPIVACAINE HCL 5 MG/ML IJ SOLN
INTRAMUSCULAR | Status: DC | PRN
  Administered 2021-11-09: 20 mL via PERINEURAL

## 2021-11-09 MED ORDER — BUPIVACAINE-EPINEPHRINE (PF) 0.5% -1:200000 IJ SOLN
INTRAMUSCULAR | Status: AC
  Filled 2021-11-09: qty 30

## 2021-11-09 MED ORDER — HYDROCODONE-ACETAMINOPHEN 5-325 MG PO TABS: 1.0000 | ORAL_TABLET | ORAL | Status: DC | PRN

## 2021-11-09 MED ORDER — OXYCODONE HCL 5 MG PO TABS: 5.0000 mg | ORAL_TABLET | Freq: Once | ORAL | Status: DC | PRN

## 2021-11-09 MED ORDER — HYDROCODONE-ACETAMINOPHEN 7.5-325 MG PO TABS
ORAL_TABLET | ORAL | Status: AC
  Filled 2021-11-09: qty 2

## 2021-11-09 MED ORDER — ASPIRIN EC 81 MG PO TBEC: 81.0000 mg | DELAYED_RELEASE_TABLET | Freq: Two times a day (BID) | ORAL | 0 refills | Status: AC

## 2021-11-09 SURGICAL SUPPLY — 55 items
ATTUNE MED DOME PAT 38 KNEE (Knees) ×2 IMPLANT
ATTUNE PS FEM LT SZ 4 CEM KNEE (Femur) ×2 IMPLANT
ATTUNE PSRP INSR SZ4 5 KNEE (Insert) ×2 IMPLANT
BAG COUNTER SPONGE SURGICOUNT (BAG) ×2 IMPLANT
BAG DECANTER FOR FLEXI CONT (MISCELLANEOUS) ×2 IMPLANT
BAG SPEC THK2 15X12 ZIP CLS (MISCELLANEOUS) ×1
BAG SPNG CNTER NS LX DISP (BAG) ×1
BAG ZIPLOCK 12X15 (MISCELLANEOUS) ×2 IMPLANT
BASE TIBIAL ROT PLAT SZ 5 KNEE (Knees) ×1 IMPLANT
BLADE SAGITTAL 25.0X1.19X90 (BLADE) ×2 IMPLANT
BLADE SAW SGTL 11.0X1.19X90.0M (BLADE) ×2 IMPLANT
BLADE SURG SZ10 CARB STEEL (BLADE) ×2 IMPLANT
BNDG ELASTIC 6X5.8 VLCR STR LF (GAUZE/BANDAGES/DRESSINGS) ×2 IMPLANT
BOOTIES KNEE HIGH SLOAN (MISCELLANEOUS) ×2 IMPLANT
BOWL SMART MIX CTS (DISPOSABLE) ×2 IMPLANT
BSPLAT TIB 5 CMNT ROT PLAT STR (Knees) ×1 IMPLANT
CEMENT HV SMART SET (Cement) ×4 IMPLANT
COVER SURGICAL LIGHT HANDLE (MISCELLANEOUS) ×2 IMPLANT
CUFF TOURN SGL QUICK 34 (TOURNIQUET CUFF) ×2
CUFF TRNQT CYL 34X4.125X (TOURNIQUET CUFF) ×1 IMPLANT
DECANTER SPIKE VIAL GLASS SM (MISCELLANEOUS) ×4 IMPLANT
DRAPE INCISE IOBAN 66X45 STRL (DRAPES) ×2 IMPLANT
DRAPE ORTHO 2.5IN SPLIT 77X108 (DRAPES) ×1 IMPLANT
DRAPE ORTHO SPLIT 77X108 STRL (DRAPES) ×2
DRAPE SHEET LG 3/4 BI-LAMINATE (DRAPES) ×2 IMPLANT
DRAPE U-SHAPE 47X51 STRL (DRAPES) ×2 IMPLANT
DRSG AQUACEL AG ADV 3.5X10 (GAUZE/BANDAGES/DRESSINGS) ×2 IMPLANT
DURAPREP 26ML APPLICATOR (WOUND CARE) ×4 IMPLANT
ELECT REM PT RETURN 15FT ADLT (MISCELLANEOUS) ×2 IMPLANT
GLOVE SRG 8 PF TXTR STRL LF DI (GLOVE) ×2 IMPLANT
GLOVE SURG ENC MOIS LTX SZ8 (GLOVE) ×4 IMPLANT
GLOVE SURG UNDER POLY LF SZ8 (GLOVE) ×4
GOWN STRL REUS W/TWL XL LVL3 (GOWN DISPOSABLE) ×4 IMPLANT
HANDPIECE INTERPULSE COAX TIP (DISPOSABLE) ×2
HOLDER FOLEY CATH W/STRAP (MISCELLANEOUS) IMPLANT
HOOD PEEL AWAY FLYTE STAYCOOL (MISCELLANEOUS) ×6 IMPLANT
KIT TURNOVER KIT A (KITS) IMPLANT
MANIFOLD NEPTUNE II (INSTRUMENTS) ×2 IMPLANT
NEEDLE HYPO 22GX1.5 SAFETY (NEEDLE) ×2 IMPLANT
NS IRRIG 1000ML POUR BTL (IV SOLUTION) ×2 IMPLANT
PACK TOTAL KNEE CUSTOM (KITS) ×2 IMPLANT
PAD ARMBOARD 7.5X6 YLW CONV (MISCELLANEOUS) ×2 IMPLANT
PROTECTOR NERVE ULNAR (MISCELLANEOUS) ×2 IMPLANT
SET HNDPC FAN SPRY TIP SCT (DISPOSABLE) ×1 IMPLANT
SUT ETHIBOND NAB CT1 #1 30IN (SUTURE) ×4 IMPLANT
SUT VIC AB 0 CT1 36 (SUTURE) ×2 IMPLANT
SUT VIC AB 2-0 CT1 27 (SUTURE) ×2
SUT VIC AB 2-0 CT1 TAPERPNT 27 (SUTURE) ×1 IMPLANT
SUT VICRYL AB 3-0 FS1 BRD 27IN (SUTURE) ×2 IMPLANT
SUT VLOC 180 0 24IN GS25 (SUTURE) ×2 IMPLANT
TIBIAL BASE ROT PLAT SZ 5 KNEE (Knees) ×2 IMPLANT
TRAY FOLEY MTR SLVR 16FR STAT (SET/KITS/TRAYS/PACK) IMPLANT
WATER STERILE IRR 1000ML POUR (IV SOLUTION) ×2 IMPLANT
WRAP KNEE MAXI GEL POST OP (GAUZE/BANDAGES/DRESSINGS) ×2 IMPLANT
YANKAUER SUCT BULB TIP NO VENT (SUCTIONS) ×2 IMPLANT

## 2021-11-09 NOTE — Transfer of Care (Signed)
Immediate Anesthesia Transfer of Care Note  Patient: Marie Carpenter  Procedure(s) Performed: LEFT TOTAL KNEE ARTHROPLASTY (Left: Knee)  Patient Location: PACU  Anesthesia Type:Spinal and MAC combined with regional for post-op pain  Level of Consciousness: awake, alert , oriented and patient cooperative  Airway & Oxygen Therapy: Patient Spontanous Breathing and Patient connected to face mask oxygen  Post-op Assessment: Report given to RN and Post -op Vital signs reviewed and stable  Post vital signs: Reviewed and stable  Last Vitals:  Vitals Value Taken Time  BP 108/45 11/09/21 1223  Temp    Pulse 67 11/09/21 1227  Resp 16 11/09/21 1227  SpO2 95 % 11/09/21 1227  Vitals shown include unvalidated device data.  Last Pain:  Vitals:   11/09/21 0927  TempSrc: Oral  PainSc: 0-No pain         Complications: No notable events documented.

## 2021-11-09 NOTE — Progress Notes (Signed)
Assisted Dr. Rose with left, ultrasound guided, adductor canal block. Side rails up, monitors on throughout procedure. See vital signs in flow sheet. Tolerated Procedure well.  

## 2021-11-09 NOTE — Anesthesia Procedure Notes (Signed)
Anesthesia Regional Block: Adductor canal block   Pre-Anesthetic Checklist: , timeout performed,  Correct Patient, Correct Site, Correct Laterality,  Correct Procedure, Correct Position, site marked,  Risks and benefits discussed,  Surgical consent,  Pre-op evaluation,  At surgeon's request and post-op pain management  Laterality: Left  Prep: chloraprep       Needles:  Injection technique: Single-shot  Needle Type: Echogenic Needle     Needle Length: 9cm      Additional Needles:   Procedures:,,,, ultrasound used (permanent image in chart),,    Narrative:  Start time: 11/09/2021 9:25 AM End time: 11/09/2021 9:31 AM Injection made incrementally with aspirations every 5 mL.  Performed by: Personally  Anesthesiologist: Eilene Ghazi, MD  Additional Notes: Patient tolerated the procedure well without complications

## 2021-11-09 NOTE — Anesthesia Procedure Notes (Signed)
Procedure Name: MAC Date/Time: 11/09/2021 10:21 AM Performed by: West Pugh, CRNA Pre-anesthesia Checklist: Patient identified, Emergency Drugs available, Suction available, Patient being monitored and Timeout performed Patient Re-evaluated:Patient Re-evaluated prior to induction Oxygen Delivery Method: Simple face mask Preoxygenation: Pre-oxygenation with 100% oxygen Induction Type: IV induction Placement Confirmation: positive ETCO2 Dental Injury: Teeth and Oropharynx as per pre-operative assessment

## 2021-11-09 NOTE — Op Note (Signed)
PREOP DIAGNOSIS: DJD LEFT KNEE POSTOP DIAGNOSIS:  same PROCEDURE: LEFT TKR ANESTHESIA: Spinal and MAC ATTENDING SURGEON: Hessie Dibble ASSISTANT: Loni Dolly PA  INDICATIONS FOR PROCEDURE: Marie Carpenter is a 80 y.o. female who has struggled for a long time with pain due to degenerative arthritis of the left knee.  The patient has failed many conservative non-operative measures and at this point has pain which limits the ability to sleep and walk.  The patient is offered total knee replacement.  Informed operative consent was obtained after discussion of possible risks of anesthesia, infection, neurovascular injury, DVT, and death.  The importance of the post-operative rehabilitation protocol to optimize result was stressed extensively with the patient.  SUMMARY OF FINDINGS AND PROCEDURE:  Marie Carpenter was taken to the operative suite where under the above anesthesia a left knee replacement was performed.  There were advanced degenerative changes and the bone quality was poor.  We used the DePuy Attune system and placed size 4 femur, 5 tibia, 38 mm all polyethylene patella, and a size 5 mm spacer.  Loni Dolly PA-C assisted throughout and was invaluable to the completion of the case in that he helped retract and maintain exposure while I placed the components.  He also helped close thereby minimizing OR time.  The patient was admitted for appropriate post-op care to include perioperative antibiotics and mechanical and pharmacologic measures for DVT prophylaxis.  DESCRIPTION OF PROCEDURE:  Marie Carpenter was taken to the operative suite where the above anesthesia was applied.  The patient was positioned supine and prepped and draped in normal sterile fashion.  An appropriate time out was performed.  After the administration of vancomycin and kefzol pre-op antibiotic the leg was elevated and exsanguinated and a tourniquet inflated.  A standard longitudinal incision was made on the anterior knee.   Dissection was carried down to the extensor mechanism.  All appropriate anti-infective measures were used including the pre-operative antibiotic, betadine impregnated drape, and closed hooded exhaust systems for each member of the surgical team.  A medial parapatellar incision was made in the extensor mechanism and the knee cap flipped and the knee flexed.  Some residual meniscal tissues were removed along with any remaining ACL/PCL tissue.  A guide was placed on the tibia and a flat cut was made on it's superior surface.  An intramedullary guide was placed in the femur and was utilized to make anterior and posterior cuts creating an appropriate flexion gap.  A second intramedullary guide was placed in the femur to make a distal cut properly balancing the knee with an extension gap equal to the flexion gap.  The three bones sized to the above mentioned sizes and the appropriate guides were placed and utilized.  A trial reduction was done and the knee easily came to full extension and the patella tracked well on flexion.  The trial components were removed and all bones were cleaned with pulsatile lavage and then dried thoroughly.  Cement was mixed and was pressurized onto the bones followed by placement of the aforementioned components.  Excess cement was trimmed and pressure was held on the components until the cement had hardened.  The tourniquet was deflated and a small amount of bleeding was controlled with cautery and pressure.  The knee was irrigated thoroughly.  The extensor mechanism was re-approximated with #1 ethibond in interrupted fashion.  The knee was flexed and the repair was solid.  The subcutaneous tissues were re-approximated with #0 and #2-0 vicryl  and the skin closed with a subcuticular stitch and steristrips.  A sterile dressing was applied.  Intraoperative fluids, EBL, and tourniquet time can be obtained from anesthesia records.  DISPOSITION:  The patient was taken to recovery room in stable  condition and admitted for appropriate post-op care to include peri-operative antibiotic and DVT prophylaxis with mechanical and pharmacologic measures.  Hessie Dibble 11/09/2021, 11:55 AM

## 2021-11-09 NOTE — Anesthesia Postprocedure Evaluation (Signed)
Anesthesia Post Note  Patient: Marie Carpenter  Procedure(s) Performed: LEFT TOTAL KNEE ARTHROPLASTY (Left: Knee)     Patient location during evaluation: PACU Anesthesia Type: Spinal Level of consciousness: oriented and awake and alert Pain management: pain level controlled Vital Signs Assessment: post-procedure vital signs reviewed and stable Respiratory status: spontaneous breathing, respiratory function stable and patient connected to nasal cannula oxygen Cardiovascular status: blood pressure returned to baseline and stable Postop Assessment: no headache, no backache and no apparent nausea or vomiting Anesthetic complications: no   No notable events documented.  Last Vitals:  Vitals:   11/09/21 1300 11/09/21 1315  BP: (!) 126/58 127/70  Pulse: 67 85  Resp: 12 19  Temp:  36.6 C  SpO2: 92% 93%    Last Pain:  Vitals:   11/09/21 1315  TempSrc:   PainSc: 0-No pain                 [name] S

## 2021-11-09 NOTE — Anesthesia Procedure Notes (Signed)
Anesthesia Procedure Image    

## 2021-11-09 NOTE — Interval H&P Note (Signed)
History and Physical Interval Note:  11/09/2021 9:18 AM  Marie Carpenter  has presented today for surgery, with the diagnosis of LEFT KNEE DEGENERATIVE JOINT DISEASE.  The various methods of treatment have been discussed with the patient and family. After consideration of risks, benefits and other options for treatment, the patient has consented to  Procedure(s): LEFT TOTAL KNEE ARTHROPLASTY (Left) as a surgical intervention.  The patient's history has been reviewed, patient examined, no change in status, stable for surgery.  I have reviewed the patient's chart and labs.  Questions were answered to the patient's satisfaction.     Velna Ochs

## 2021-11-09 NOTE — Anesthesia Procedure Notes (Signed)
Spinal  Patient location during procedure: OR Start time: 11/09/2021 10:23 AM End time: 11/09/2021 10:27 AM Reason for block: surgical anesthesia Staffing Performed: resident/CRNA  Anesthesiologist: Myrtie Soman, MD Resident/CRNA: West Pugh, CRNA Preanesthetic Checklist Completed: patient identified, IV checked, site marked, risks and benefits discussed, surgical consent, monitors and equipment checked, pre-op evaluation and timeout performed Spinal Block Patient position: sitting Prep: DuraPrep Patient monitoring: heart rate, continuous pulse ox and blood pressure Approach: midline Location: L3-4 Injection technique: single-shot Needle Needle type: Pencan and Introducer  Needle gauge: 24 G Needle length: 10 cm Assessment Sensory level: T4 Events: CSF return Additional Notes IV functioning, monitors applied to pt. Expiration date of kit checked and confirmed to be in date. Sterile prep and drape, hand hygiene and sterile gloved used. Pt was positioned and spine was prepped in sterile fashion. Skin was anesthetized with lidocaine. Free flow of clear CSF obtained prior to injecting local anesthetic into CSF x 1 attempt. Spinal needle aspirated freely following injection. Needle was carefully withdrawn, and pt tolerated procedure well. Loss of motor and sensory on exam post injection. Dr Kalman Shan present for procedure.

## 2021-11-09 NOTE — Evaluation (Signed)
Physical Therapy Evaluation Patient Details Name: Marie Carpenter MRN: [id number] DOB: [birth year] Today's Date: 11/09/2021  History of Present Illness  Pt is 80 yo female s/p L TKA on 11/09/21.  Pt with hx including but not limited to osteoporosis, HTN, arthritis, and she reports knee arthoscopy sx.  Clinical Impression  Pt is s/p TKA resulting in the deficits listed below (see PT Problem List). At baseline , pt lives alone and independent but her daughter will be staying with her at d/c.  Pt seen in PACU for possible same day surgery.  Pt was able to ambulate 100' with RW and min guard.  Demonstrated safe gait and transfers with supervision/minguard.  Pt does not have stairs.  She reported 8/10 pain but moved well, motivated to get up, and with minimal signs of severe pain.  Pt had difficulty with terminal knee ext- stressed importance of quad sets and resting with leg straight. Pt demonstrates mobility necessary to return home from PT perspective but Pt will benefit from skilled PT to increase their independence and safety with mobility to allow discharge to the venue listed below.         Recommendations for follow up therapy are one component of a multi-disciplinary discharge planning process, led by the attending physician.  Recommendations may be updated based on patient status, additional functional criteria and insurance authorization.  Follow Up Recommendations Follow physician's recommendations for discharge plan and follow up therapies    Assistance Recommended at Discharge Intermittent Supervision/Assistance  Functional Status Assessment Patient has had a recent decline in their functional status and demonstrates the ability to make significant improvements in function in a reasonable and predictable amount of time.  Equipment Recommendations  None recommended by PT    Recommendations for Other Services       Precautions / Restrictions Precautions Precautions:  Fall Restrictions Weight Bearing Restrictions: Yes LLE Weight Bearing: Weight bearing as tolerated      Mobility  Bed Mobility Overal bed mobility: Needs Assistance Bed Mobility: Supine to Sit;Sit to Supine     Supine to sit: Supervision Sit to supine: Supervision   General bed mobility comments: Pt self assisting L LE with R LE    Transfers Overall transfer level: Needs assistance Equipment used: Rolling walker (2 wheels) Transfers: Sit to/from Stand Sit to Stand: Min guard           General transfer comment: Min guard for safety; cues for hand placement    Ambulation/Gait Ambulation/Gait assistance: Min guard Gait Distance (Feet): 100 Feet Assistive device: Rolling walker (2 wheels) Gait Pattern/deviations: Step-to pattern;Decreased stride length;Decreased weight shift to left Gait velocity: decreased     General Gait Details: Cues for RW proximity and small steps as needed for pain control; steady - no LOB  Stairs Stairs:  (pt does not have stairs)          Wheelchair Mobility    Modified Rankin (Stroke Patients Only)       Balance Overall balance assessment: Needs assistance Sitting-balance support: No upper extremity supported Sitting balance-Leahy Scale: Normal     Standing balance support: Bilateral upper extremity supported;No upper extremity supported Standing balance-Leahy Scale: Fair Standing balance comment: RW to ambulate but could static stand without AD                             Pertinent Vitals/Pain Pain Assessment: 0-10 Pain Score: 8  Pain Location: L knee Pain Descriptors /  Indicators: Sharp;Discomfort (no overt signs of pain; wants to mobilize) Pain Intervention(s): Limited activity within patient's tolerance;Monitored during session;Repositioned;Patient requesting pain meds-RN notified    Home Living Family/patient expects to be discharged to:: Private residence Living Arrangements: Alone Available Help at  Discharge: Family;Available 24 hours/day (daughter staying with her at d/c) Type of Home: Apartment Home Access: Level entry       Home Layout: One level Home Equipment: Grab bars - tub/shower;Rolling Walker (2 wheels) Additional Comments: ordered shower chair    Prior Function Prior Level of Function : Independent/Modified Independent;Driving             Mobility Comments: Walks in community ADLs Comments: Ind with ADLS, IADLS, and shopping     Hand Dominance        Extremity/Trunk Assessment   Upper Extremity Assessment Upper Extremity Assessment: Overall WFL for tasks assessed    Lower Extremity Assessment Lower Extremity Assessment: LLE deficits/detail;RLE deficits/detail RLE Deficits / Details: ROM WFL; MMT 5/5 LLE Deficits / Details: Expected post op changes.  ROM: ankle and hip WFL, knee - pt has difficulty with terminal ext, ROM 12 to 80 degrees; MMT: ankle 5/5; hip and knee 3/5 not further tested LLE Sensation: WNL LLE Coordination: WNL    Cervical / Trunk Assessment Cervical / Trunk Assessment: Normal  Communication   Communication: No difficulties  Cognition Arousal/Alertness: Awake/alert Behavior During Therapy: WFL for tasks assessed/performed Overall Cognitive Status: Within Functional Limits for tasks assessed                                          General Comments General comments (skin integrity, edema, etc.): VSS  Educated on safe ice use, no pivots, car transfers, resting with leg straight.  Pt having difficulty with terminal knee ext (tends to cross legs)- stressed importance of resting with legs straight and demonstrated pillow under ankle for stretch.  Encouraged quad sets frequently.  Also, encouraged walking every 1-2 hours during day. Educated on HEP with focus on mobility the first weeks. Discussed doing exercises within pain control and if pain increasing could decreased ROM, reps, and stop exercises as needed.  Encouraged to perform quad sets and ankle pumps frequently for blood flow and to promote full knee extension.     Exercises Total Joint Exercises Ankle Circles/Pumps: AROM;Both;10 reps;Supine Quad Sets: AROM;Both;10 reps;Supine Towel Squeeze: AROM;Both;5 reps;Supine Heel Slides: AROM;Left;5 reps;Supine Hip ABduction/ADduction: AROM;Left;5 reps;Supine Long Arc Quad: AROM;Left;5 reps;Seated (tactile cues to keep thigh on table) Knee Flexion: AROM;Seated;Left;5 reps Goniometric ROM: L knee - 12 degrees to 80 degrees   Assessment/Plan    PT Assessment Patient needs continued PT services  PT Problem List Decreased strength;Decreased mobility;Decreased safety awareness;Decreased range of motion;Decreased activity tolerance;Decreased balance;Decreased knowledge of use of DME;Pain       PT Treatment Interventions DME instruction;Therapeutic activities;Modalities;Gait training;Therapeutic exercise;Patient/family education;Balance training;Functional mobility training;Stair training    PT Goals (Current goals can be found in the Care Plan section)  Acute Rehab PT Goals Patient Stated Goal: return home PT Goal Formulation: With patient Time For Goal Achievement: 11/23/21 Potential to Achieve Goals: Good    Frequency 7X/week   Barriers to discharge        Co-evaluation               AM-PAC PT "6 Clicks" Mobility  Outcome Measure Help needed turning from your back to your side while  in a flat bed without using bedrails?: A Little Help needed moving from lying on your back to sitting on the side of a flat bed without using bedrails?: A Little Help needed moving to and from a bed to a chair (including a wheelchair)?: A Little Help needed standing up from a chair using your arms (e.g., wheelchair or bedside chair)?: A Little Help needed to walk in hospital room?: A Little Help needed climbing 3-5 steps with a railing? : A Little 6 Click Score: 18    End of Session Equipment  Utilized During Treatment: Gait belt Activity Tolerance: Patient tolerated treatment well Patient left: in bed;with call bell/phone within reach Nurse Communication: Mobility status PT Visit Diagnosis: Other abnormalities of gait and mobility (R26.89);Muscle weakness (generalized) (M62.81)    Time: 0258-5277 PT Time Calculation (min) (ACUTE ONLY): 30 min   Charges:   PT Evaluation $PT Eval Low Complexity: 1 Low PT Treatments $Gait Training: 8-22 mins        Anise Salvo, PT Acute Rehab Services Pager [phone] [facility] Re[facility] [phone]   Rayetta Humphrey 11/09/2021, 3:21 PM

## 2021-11-09 NOTE — Anesthesia Preprocedure Evaluation (Signed)
Anesthesia Evaluation  Patient identified by MRN, date of birth, ID band Patient awake    Reviewed: Allergy & Precautions, H&P , NPO status , Patient's Chart, lab work & pertinent test results  Airway Mallampati: II  TM Distance: >3 FB Neck ROM: Full    Dental no notable dental hx.    Pulmonary neg pulmonary ROS, former smoker,    Pulmonary exam normal breath sounds clear to auscultation       Cardiovascular hypertension, Pt. on medications Normal cardiovascular exam Rhythm:Regular Rate:Normal     Neuro/Psych negative neurological ROS  negative psych ROS   GI/Hepatic Neg liver ROS, hiatal hernia, PUD,   Endo/Other  negative endocrine ROS  Renal/GU negative Renal ROS  negative genitourinary   Musculoskeletal negative musculoskeletal ROS (+)   Abdominal   Peds negative pediatric ROS (+)  Hematology negative hematology ROS (+)   Anesthesia Other Findings   Reproductive/Obstetrics negative OB ROS                             Anesthesia Physical Anesthesia Plan  ASA: 2  Anesthesia Plan: Spinal   Post-op Pain Management: Regional block   Induction: Intravenous  PONV Risk Score and Plan: 2 and Propofol infusion, Ondansetron and Treatment may vary due to age or medical condition  Airway Management Planned: Simple Face Mask  Additional Equipment:   Intra-op Plan:   Post-operative Plan:   Informed Consent: I have reviewed the patients History and Physical, chart, labs and discussed the procedure including the risks, benefits and alternatives for the proposed anesthesia with the patient or authorized representative who has indicated his/her understanding and acceptance.     Dental advisory given  Plan Discussed with: CRNA and Surgeon  Anesthesia Plan Comments:         Anesthesia Quick Evaluation

## 2021-11-10 ENCOUNTER — Encounter (HOSPITAL_COMMUNITY): Payer: Self-pay | Admitting: Orthopaedic Surgery

## 2021-11-12 DIAGNOSIS — M810 Age-related osteoporosis without current pathological fracture: Secondary | ICD-10-CM | POA: Diagnosis not present

## 2021-11-12 DIAGNOSIS — K279 Peptic ulcer, site unspecified, unspecified as acute or chronic, without hemorrhage or perforation: Secondary | ICD-10-CM | POA: Diagnosis not present

## 2021-11-12 DIAGNOSIS — Z471 Aftercare following joint replacement surgery: Secondary | ICD-10-CM | POA: Diagnosis not present

## 2021-11-12 DIAGNOSIS — Z87891 Personal history of nicotine dependence: Secondary | ICD-10-CM | POA: Diagnosis not present

## 2021-11-12 DIAGNOSIS — Z96652 Presence of left artificial knee joint: Secondary | ICD-10-CM | POA: Diagnosis not present

## 2021-11-12 DIAGNOSIS — I10 Essential (primary) hypertension: Secondary | ICD-10-CM | POA: Diagnosis not present

## 2021-11-12 DIAGNOSIS — K449 Diaphragmatic hernia without obstruction or gangrene: Secondary | ICD-10-CM | POA: Diagnosis not present

## 2021-11-12 DIAGNOSIS — K2270 Barrett's esophagus without dysplasia: Secondary | ICD-10-CM | POA: Diagnosis not present

## 2021-11-12 DIAGNOSIS — Z7982 Long term (current) use of aspirin: Secondary | ICD-10-CM | POA: Diagnosis not present

## 2021-11-12 DIAGNOSIS — R7303 Prediabetes: Secondary | ICD-10-CM | POA: Diagnosis not present

## 2021-11-13 DIAGNOSIS — K449 Diaphragmatic hernia without obstruction or gangrene: Secondary | ICD-10-CM | POA: Diagnosis not present

## 2021-11-13 DIAGNOSIS — I10 Essential (primary) hypertension: Secondary | ICD-10-CM | POA: Diagnosis not present

## 2021-11-13 DIAGNOSIS — Z87891 Personal history of nicotine dependence: Secondary | ICD-10-CM | POA: Diagnosis not present

## 2021-11-13 DIAGNOSIS — K2270 Barrett's esophagus without dysplasia: Secondary | ICD-10-CM | POA: Diagnosis not present

## 2021-11-13 DIAGNOSIS — Z471 Aftercare following joint replacement surgery: Secondary | ICD-10-CM | POA: Diagnosis not present

## 2021-11-13 DIAGNOSIS — R7303 Prediabetes: Secondary | ICD-10-CM | POA: Diagnosis not present

## 2021-11-13 DIAGNOSIS — K279 Peptic ulcer, site unspecified, unspecified as acute or chronic, without hemorrhage or perforation: Secondary | ICD-10-CM | POA: Diagnosis not present

## 2021-11-13 DIAGNOSIS — Z96652 Presence of left artificial knee joint: Secondary | ICD-10-CM | POA: Diagnosis not present

## 2021-11-13 DIAGNOSIS — Z7982 Long term (current) use of aspirin: Secondary | ICD-10-CM | POA: Diagnosis not present

## 2021-11-13 DIAGNOSIS — M810 Age-related osteoporosis without current pathological fracture: Secondary | ICD-10-CM | POA: Diagnosis not present

## 2021-11-14 DIAGNOSIS — K2270 Barrett's esophagus without dysplasia: Secondary | ICD-10-CM | POA: Diagnosis not present

## 2021-11-14 DIAGNOSIS — M810 Age-related osteoporosis without current pathological fracture: Secondary | ICD-10-CM | POA: Diagnosis not present

## 2021-11-14 DIAGNOSIS — Z87891 Personal history of nicotine dependence: Secondary | ICD-10-CM | POA: Diagnosis not present

## 2021-11-14 DIAGNOSIS — K279 Peptic ulcer, site unspecified, unspecified as acute or chronic, without hemorrhage or perforation: Secondary | ICD-10-CM | POA: Diagnosis not present

## 2021-11-14 DIAGNOSIS — I10 Essential (primary) hypertension: Secondary | ICD-10-CM | POA: Diagnosis not present

## 2021-11-14 DIAGNOSIS — Z7982 Long term (current) use of aspirin: Secondary | ICD-10-CM | POA: Diagnosis not present

## 2021-11-14 DIAGNOSIS — K449 Diaphragmatic hernia without obstruction or gangrene: Secondary | ICD-10-CM | POA: Diagnosis not present

## 2021-11-14 DIAGNOSIS — Z471 Aftercare following joint replacement surgery: Secondary | ICD-10-CM | POA: Diagnosis not present

## 2021-11-14 DIAGNOSIS — R7303 Prediabetes: Secondary | ICD-10-CM | POA: Diagnosis not present

## 2021-11-14 DIAGNOSIS — Z96652 Presence of left artificial knee joint: Secondary | ICD-10-CM | POA: Diagnosis not present

## 2021-11-15 DIAGNOSIS — R7303 Prediabetes: Secondary | ICD-10-CM | POA: Diagnosis not present

## 2021-11-15 DIAGNOSIS — Z87891 Personal history of nicotine dependence: Secondary | ICD-10-CM | POA: Diagnosis not present

## 2021-11-15 DIAGNOSIS — Z471 Aftercare following joint replacement surgery: Secondary | ICD-10-CM | POA: Diagnosis not present

## 2021-11-15 DIAGNOSIS — K449 Diaphragmatic hernia without obstruction or gangrene: Secondary | ICD-10-CM | POA: Diagnosis not present

## 2021-11-15 DIAGNOSIS — Z96652 Presence of left artificial knee joint: Secondary | ICD-10-CM | POA: Diagnosis not present

## 2021-11-15 DIAGNOSIS — K2270 Barrett's esophagus without dysplasia: Secondary | ICD-10-CM | POA: Diagnosis not present

## 2021-11-15 DIAGNOSIS — I10 Essential (primary) hypertension: Secondary | ICD-10-CM | POA: Diagnosis not present

## 2021-11-15 DIAGNOSIS — M810 Age-related osteoporosis without current pathological fracture: Secondary | ICD-10-CM | POA: Diagnosis not present

## 2021-11-15 DIAGNOSIS — Z7982 Long term (current) use of aspirin: Secondary | ICD-10-CM | POA: Diagnosis not present

## 2021-11-15 DIAGNOSIS — K279 Peptic ulcer, site unspecified, unspecified as acute or chronic, without hemorrhage or perforation: Secondary | ICD-10-CM | POA: Diagnosis not present

## 2021-11-16 DIAGNOSIS — Z87891 Personal history of nicotine dependence: Secondary | ICD-10-CM | POA: Diagnosis not present

## 2021-11-16 DIAGNOSIS — R7303 Prediabetes: Secondary | ICD-10-CM | POA: Diagnosis not present

## 2021-11-16 DIAGNOSIS — M810 Age-related osteoporosis without current pathological fracture: Secondary | ICD-10-CM | POA: Diagnosis not present

## 2021-11-16 DIAGNOSIS — K2270 Barrett's esophagus without dysplasia: Secondary | ICD-10-CM | POA: Diagnosis not present

## 2021-11-16 DIAGNOSIS — Z7982 Long term (current) use of aspirin: Secondary | ICD-10-CM | POA: Diagnosis not present

## 2021-11-16 DIAGNOSIS — K449 Diaphragmatic hernia without obstruction or gangrene: Secondary | ICD-10-CM | POA: Diagnosis not present

## 2021-11-16 DIAGNOSIS — Z471 Aftercare following joint replacement surgery: Secondary | ICD-10-CM | POA: Diagnosis not present

## 2021-11-16 DIAGNOSIS — I10 Essential (primary) hypertension: Secondary | ICD-10-CM | POA: Diagnosis not present

## 2021-11-16 DIAGNOSIS — Z96652 Presence of left artificial knee joint: Secondary | ICD-10-CM | POA: Diagnosis not present

## 2021-11-16 DIAGNOSIS — K279 Peptic ulcer, site unspecified, unspecified as acute or chronic, without hemorrhage or perforation: Secondary | ICD-10-CM | POA: Diagnosis not present

## 2021-11-17 DIAGNOSIS — M25562 Pain in left knee: Secondary | ICD-10-CM | POA: Diagnosis not present

## 2021-11-17 DIAGNOSIS — Z9889 Other specified postprocedural states: Secondary | ICD-10-CM | POA: Diagnosis not present

## 2021-11-18 DIAGNOSIS — K279 Peptic ulcer, site unspecified, unspecified as acute or chronic, without hemorrhage or perforation: Secondary | ICD-10-CM | POA: Diagnosis not present

## 2021-11-18 DIAGNOSIS — Z471 Aftercare following joint replacement surgery: Secondary | ICD-10-CM | POA: Diagnosis not present

## 2021-11-18 DIAGNOSIS — Z7982 Long term (current) use of aspirin: Secondary | ICD-10-CM | POA: Diagnosis not present

## 2021-11-18 DIAGNOSIS — Z96652 Presence of left artificial knee joint: Secondary | ICD-10-CM | POA: Diagnosis not present

## 2021-11-18 DIAGNOSIS — Z87891 Personal history of nicotine dependence: Secondary | ICD-10-CM | POA: Diagnosis not present

## 2021-11-18 DIAGNOSIS — R7303 Prediabetes: Secondary | ICD-10-CM | POA: Diagnosis not present

## 2021-11-18 DIAGNOSIS — K2270 Barrett's esophagus without dysplasia: Secondary | ICD-10-CM | POA: Diagnosis not present

## 2021-11-18 DIAGNOSIS — K449 Diaphragmatic hernia without obstruction or gangrene: Secondary | ICD-10-CM | POA: Diagnosis not present

## 2021-11-18 DIAGNOSIS — I10 Essential (primary) hypertension: Secondary | ICD-10-CM | POA: Diagnosis not present

## 2021-11-18 DIAGNOSIS — M810 Age-related osteoporosis without current pathological fracture: Secondary | ICD-10-CM | POA: Diagnosis not present

## 2021-11-19 DIAGNOSIS — Z96652 Presence of left artificial knee joint: Secondary | ICD-10-CM | POA: Diagnosis not present

## 2021-11-19 DIAGNOSIS — M25662 Stiffness of left knee, not elsewhere classified: Secondary | ICD-10-CM | POA: Diagnosis not present

## 2021-11-22 ENCOUNTER — Inpatient Hospital Stay: Admit: 2021-11-22 | Payer: MEDICARE | Primary: Student in an Organized Health Care Education/Training Program

## 2021-11-22 DIAGNOSIS — M25562 Pain in left knee: Secondary | ICD-10-CM | POA: Diagnosis not present

## 2021-11-22 NOTE — Other (Signed)
Therapy Evaluation by Judee Clara, PT at 11/22/21 2778                Author: Judee Clara, PT  Service: --  Author Type: Physical Therapist       Filed: 11/23/21 0911  Date of Service: 11/22/21 0850  Status: Signed          Editor: Judee Clara, PT (Physical Therapist)               Sondra Barges [facility] THERAPY   [address],[place], [place] [address] - Phone: [phone]  Fax: [phone]   PLAN OF CARE / STATEMENT  OF MEDICAL NECESSITY FOR PHYSICAL THERAPY SERVICES        Patient Name:  Samantha Serrano  DOB:  12-May-1941          Medical    Diagnosis:  Left knee pain s/p L TKR  Treatment Diagnosis:  Left knee pain [M25.562]          Onset Date:  DOS 11/09/2021              Referral Source:  Marcene Corning, MD  Start of Care [facility]):  11/22/2021          Prior Hospitalization:  See medical history  Provider #:  [id number]        Prior Level of Function:  Ind and pain free with ADLs and activities of leisure        Comorbidities:  Osteoporosis. Pre-diabetic. Arthritis. HTN.        Medications:  Verified on Patient Summary List     The Plan of Care and following information is based on the information from the initial evaluation.    ===========================================================================================   Assessment / key information:  Patient is a 80 year old female. Primary complaints of left knee pain that began on 11/09/21 as she is s/p L TKR  by Dr. Jerl Santos Locates pain at Left knee and thigh. Rates pain as 1-9/10 from best to worst. Pain today is rated as 1/10. Describes pain as sharp pains on outside of knee and into shin, and pulling with mobility/stretching and intermittent in nature.  Sitting, rest, ice, and Tylenol makes the pain better. Moving, being on feet for prolonged periods makes the pain worse. Patient is retired. Lives in first floor apartment with no stairs. Is at daughters house for 2 weeks and then will return to  [place],  [place] to go home after Christmas. Patient enjoys taking part in going to her church events and participating in her cards group. Their primary goals in PT are to improve ROM, strength and improve walking ability in order to take part in these activities  of leisure. Patient recently saw their referring provider who prescribed OPPT. They are scheduled to return to their provider on 12/15/2021.   ===========================================================================================   Objective Measures:   Observation: Pleasant and cooperative female. Appears stated age. Petite frame/build.   Posture: Good and upright in seated, slight forwards trunk lean over FWW   Palpation: Mild TTP along incision, no numbness noted by patient    ROM: 3 degrees lacking TKE to 100 degrees flexion   Strength: Not assessed due to recent surgery   Flexibility: Hamstrings and hip rotation moderately impaired   Skin Integrity: Healing well, mild bruising at upper thigh and surrounding knee; some scabbing along incision; no signs of infection, irritation or DVT   Swelling/Edema: Moderate in nature; to be  expected post op at this time   SI Joint Assessment: Not assessed    Positional Tolerances: Able to tolerate sitting EOB, longsitting; supine   FOTO Score: TBA/100 Goal ---> TBA/100      HEP/Patient Education Provided:   - Discussed diagnosis and anatomy involved; educated on healing timelines   - Discussed PT POC and goals, expectations of attendance and compliance with HEP   - Discussed no show/cancellation policy and to alert PT to any changes   - Discussed ice versus heat and instructed patient to ICE 3-4x/day x 10-15 minutes per session         Access Code: [email or web address]   URL: [email or web address]   Date: 11/22/2021   Prepared by: Judee Clara      Exercises   Supine Gluteal Sets - 1 x daily - 7 x weekly - 2 sets - 10 reps - 5 hold   Long Sitting Quad Set - 1 x daily - 7 x weekly - 2 sets - 10 reps - 5  hold   Supine Isometric Hamstring Set - 1 x daily - 7 x weekly - 2 sets - 10 reps - 5 hold   Supine Hip Adduction Isometric with Ball - 1 x daily - 7 x weekly - 2 sets - 10 reps - 5 hold   Supine Knee Extension Strengthening - 1 x daily - 7 x weekly - 2 sets - 10 reps - 5 hold   Supine Active Straight Leg Raise - 1 x daily - 7 x weekly - 3 sets - 10 reps   Supine Bridge - 1 x daily - 7 x weekly - 2 sets - 10 reps - 5 hold   Supine Heel Slide with Strap - 1 x daily - 7 x weekly - 2 sets - 10 reps - 5 hold      ===========================================================================================   Eval Complexity: History MEDIUM   Complexity : 1-2 comorbidities / personal factors will impact the outcome/ POC ;  Examination  MEDIUM Complexity : 3 Standardized tests and measures  addressing body structure, function, activity limitation and / or participation in recreation ; Presentation MEDIUM Complexity : Evolving with  changing characteristics ;  Decision Making MEDIUM Complexity : FOTO score of 26-74;  Overall Complexity MEDIUM   Problem List: pain affecting function, decrease ROM, decrease strength, impaired gait/ balance, decrease ADL/ functional abilitiies, decrease activity  tolerance, decrease flexibility/ joint mobility, and decrease transfer abilities    Treatment Plan may include any combination of the following: Therapeutic exercise, Neuromuscular reeducation, Manual therapy, Therapeutic activity,  Self care/home management, Electric stim unattended , Vasopneumatic device, Aquatic therapy, and Gait training   Patient / Family readiness to learn indicated by: asking questions, trying to perform skills, and interest   Persons(s) to be included in education: patient (P)   Barriers to Learning/Limitations: None      Measures taken, if barriers to learning:          Patient Goal (s):  Patient would like to return to attending card group and going to her church events     Patient self reported health status:  good   Rehabilitation Potential: good     Short Term Goals: To be accomplished in  3   weeks:   Patient will show compliance with regular HEP performance as prescribed by PT.   Patient will reduce max pain to 5/10 in order to perform iADLs and ADLs more comfortably.   Patient will improve Quad and Ham  strength to 4/5 for prolonged periods of sitting and standing.   Patient will improve L knee ROM to 0-115 degrees for improved functional mobility.        Long Term Goals: To be accomplished in  6   weeks:   Patient will reduce max pain to 2/10 in order to perform walking for >15-30 minutes.   Patient will improve Quad and Ham strength to 5/5 in order to return to church events.   Patient will improve L knee ROM to 0-125 degrees for improved functional mobility.   Patient will improve FOTO score to TBA for improved functional mobility and to return to PLOF.       Frequency / Duration:   Patient to be seen  2-3  times per week for  6  weeks:   Patient / Caregiver education and instruction: self care, activity modification, and exercises        Therapist Signature:  Judee Clara, PT  Date:  11/22/2021          Certification Period:  11/22/2021 to 02/20/2022  Time:  6:50 AM     ===========================================================================================   I certify that the above Physical Therapy Services are being furnished while the patient is under my care.   I agree with the treatment plan and certify that this therapy is necessary.      Physician Signature:        Date:        Time:                                         Marcene Corning, MD   Please sign and return to [facility] Physical Therapy at [facility] or you may fax the  signed copy to [phone].  Thank you.

## 2021-11-22 NOTE — Progress Notes (Signed)
 PHYSICAL THERAPY - DAILY TREATMENT NOTE    Patient Name: Samantha Serrano        Date: 11/22/2021  DOB: [birth year]   yes Patient DOB Verified  Visit #:   1   of   6  Insurance: Payor: AARP MEDICARE COMPLETE / Plan: [facility] AARP MEDICARE COMPLETE / Product Type: Managed Care Medicare /      In time: 8:50 am Out time: 9:40 am   Total Treatment Time: 50     Medicare/BCBS Time Tracking (below)   Total Timed Codes (min):  30 1:1 Treatment Time:  40     TREATMENT AREA =  Left knee pain [M25.562]    SUBJECTIVE  Pain Level (on 0 to 10 scale):  1  / 10   Medication Changes/New allergies or changes in medical history, any new surgeries or procedures?    no  If yes, update Summary List   Subjective Functional Status/Changes:  []   No changes reported     See POC          OBJECTIVE  Modalities Rationale:     decrease inflammation and decrease pain to improve patient's ability to perform functional mobility tasks and activities.   min []  Estim, type/location:                                      []   att     []   unatt     []   w/US      []   w/ice    []   w/heat    min []   Mechanical Traction: type/lbs                   []   pro   []   sup   []   int   []   cont    []   before manual    []   after manual    min []   Ultrasound, settings/location:      min []   Iontophoresis w/ dexamethasone, location:                                               []   take home patch       []   in clinic   10 min [x]   Ice     []   Heat    location/position: L knee; ant/post; supine with LE elevated    min []   Vasopneumatic Device, press/temp:    If using vaso (only need to measure limb vaso being performed on)      pre-treatment girth :       post-treatment girth :       measured at (landmark location) :      min []   Other:    [x]  Skin assessment post-treatment (if applicable):    [x]   intact    []   redness- no adverse reaction                  [] redness - adverse reaction:      15 min Therapeutic Exercise:  [x]   See flow sheet   Rationale:      increase ROM and  increase strength to improve the patient's ability to to improve patient's ability to perform functional mobility tasks and activities.  15 min Self Care: Discussed diagnosis, prognosis, POC/PT Expectations (HEP performance and regular attendance), HEP/exercises, ice versus heat.   Rationale:    Patient Education to improve the patient's ability to to improve patient's ability to perform functional mobility tasks and activities.    Billed With/As:   []  TE   []  TA   []  Neuro   []  Self Care Patient Education: [x]  Review HEP    []  Progressed/Changed HEP based on:   []  positioning   []  body mechanics   []  transfers   []  heat/ice application    []  other:      Other Objective/Functional Measures:    See POC     Post Treatment Pain Level (on 0 to 10) scale:   1  / 10     ASSESSMENT  Assessment/Changes in Function:     See POC.     []   See Progress Note/Recertification   Patient will continue to benefit from skilled PT services to modify and progress therapeutic interventions, address functional mobility deficits, address ROM deficits, address strength deficits, analyze and address soft tissue restrictions, analyze and cue movement patterns, analyze and modify body mechanics/ergonomics, and assess and modify postural abnormalities to attain remaining goals.   Progress toward goals / Updated goals:    See POC     PLAN  [x]   Upgrade activities as tolerated yes Continue plan of care   []   Discharge due to :    []   Other:      Therapist: Benedict Going, PT    Date: 11/22/2021 Time: 6:50 AM     Future Appointments   Date Time Provider Department Center   11/22/2021  8:50 AM Datsko, Jana, PT [facility]

## 2021-11-23 ENCOUNTER — Inpatient Hospital Stay: Admit: 2021-11-23 | Payer: MEDICARE | Primary: Student in an Organized Health Care Education/Training Program

## 2021-11-23 DIAGNOSIS — M25562 Pain in left knee: Secondary | ICD-10-CM | POA: Diagnosis not present

## 2021-11-23 NOTE — Progress Notes (Signed)
 PHYSICAL THERAPY - DAILY TREATMENT NOTE    Patient Name: Samantha Serrano        Date: 11/23/2021  DOB: 1941-08-31   yes Patient DOB Verified  Visit #:   2   of   12  Insurance: Payor: AARP MEDICARE COMPLETE / Plan: [facility] AARP MEDICARE COMPLETE / Product Type: Managed Care Medicare /      In time: 1020 Out time: 1105   Total Treatment Time: 45     Medicare/BCBS Time Tracking (below)   Total Timed Codes (min):  45 1:1 Treatment Time:  45     TREATMENT AREA =  Left knee pain [M25.562]    SUBJECTIVE  Pain Level (on 0 to 10 scale):  0  / 10   Medication Changes/New allergies or changes in medical history, any new surgeries or procedures?    no  If yes, update Summary List   Subjective Functional Status/Changes:  []   No changes reported     I'm not really having pain now.          OBJECTIVE  Modalities Rationale:     decrease inflammation, decrease pain, and increase tissue extensibility to improve patient's ability to perform functional mobility activities with decrease c/o symptoms.   min []  Estim, type/location:                                      []   att     []   unatt     []   w/US      []   w/ice    []   w/heat    min []   Mechanical Traction: type/lbs                   []   pro   []   sup   []   int   []   cont    []   before manual    []   after manual    min []   Ultrasound, settings/location:      min []   Iontophoresis w/ dexamethasone, location:                                               []   take home patch       []   in clinic    min []   Ice     []   Heat    location/position:     min []   Vasopneumatic Device, press/temp:    If using vaso (only need to measure limb vaso being performed on)      pre-treatment girth :       post-treatment girth :       measured at (landmark location) :      min []   Other:    [x]  Skin assessment post-treatment (if applicable):    [x]   intact    []   redness- no adverse reaction                  [] redness - adverse reaction:      20 min Therapeutic Exercise:  [x]   See flow sheet   Rationale:       increase ROM, increase strength, improve coordination, and improve balance to improve the patient's ability to perform functional mobility activities with decrease c/o symptoms.  15 min Manual Therapy: PROM, patella mob, scar massage   Rationale:      decrease pain, increase ROM, and increase tissue extensibility to improve patient's ability to perform functional mobility activities with decrease c/o symptoms.  The manual therapy interventions were performed at a separate and distinct time from the therapeutic activities interventions.     min Therapeutic Activity: [x]   See flow sheet   Rationale:    increase ROM, increase strength, improve coordination, and improve balance to improve the patient's ability to perform functional mobility activities with decrease c/o symptoms.     min Neuromuscular Re-ed: [x]   See flow sheet   Rationale:    increase ROM, increase strength, improve coordination, and improve balance to improve the patient's ability to perform functional mobility activities with decrease c/o symptoms.    10 min Gait Training:  ___ feet with ___ device on level surfaces with ___ level of assistance: pregait   Rationale: To improve ambulation safety and efficiency in order to improve patient's ability to safely ambulate at home for self care.     min Self Care:    Rationale:    increase ROM, increase strength, and improve coordination to improve the patient's ability to perform functional mobility activities with decrease c/o symptoms.    Billed With/As:   []  TE   []  TA   []  Neuro   []  Self Care Patient Education: [x]  Review HEP    []  Progressed/Changed HEP based on:   []  positioning   []  body mechanics   []  transfers   []  heat/ice application    []  other:      Other Objective/Functional Measures:    No change in functional measurements today.     Post Treatment Pain Level (on 0 to 10) scale:   0  / 10     ASSESSMENT  Assessment/Changes in Function:     Overall good tolerance to all therapeutic  interventions for first treatment session.     []   See Progress Note/Recertification   Patient will continue to benefit from skilled PT services to modify and progress therapeutic interventions, address functional mobility deficits, address ROM deficits, address strength deficits, analyze and address soft tissue restrictions, and analyze and cue movement patterns to attain remaining goals.   Progress toward goals / Updated goals:    Short Term Goals: To be accomplished in  3  weeks:  Patient will show compliance with regular HEP performance as prescribed by PT.  Patient will reduce max pain to 5/10 in order to perform iADLs and ADLs more comfortably.  Patient will improve Quad and Ham strength to 4/5 for prolonged periods of sitting and standing.  Patient will improve L knee ROM to 0-115 degrees for improved functional mobility.     Long Term Goals: To be accomplished in  6  weeks:  Patient will reduce max pain to 2/10 in order to perform walking for >15-30 minutes.  Patient will improve Quad and Ham strength to 5/5 in order to return to church events.  Patient will improve L knee ROM to 0-125 degrees for improved functional mobility.  Patient will improve FOTO score to TBA for improved functional mobility and to return to PLOF.      PLAN  []   Upgrade activities as tolerated yes Continue plan of care   []   Discharge due to :    []   Other:      Therapist: Curtistine JONELLE Jacks, PTA    Date: 11/23/2021 Time: 10:01  AM     Future Appointments   Date Time Provider Department Center   11/23/2021 10:20 AM Evertt Curtistine JONELLE JOSETTA [facility]   11/26/2021  9:45 AM Livingston Pointer, PT [facility]   11/30/2021  9:45 AM Livingston Pointer, PT [facility]   12/03/2021  9:45 AM Datsko, Jana, PT [facility]

## 2021-11-26 ENCOUNTER — Inpatient Hospital Stay: Admit: 2021-11-26 | Payer: MEDICARE | Primary: Student in an Organized Health Care Education/Training Program

## 2021-11-26 DIAGNOSIS — M25562 Pain in left knee: Secondary | ICD-10-CM | POA: Diagnosis not present

## 2021-11-26 NOTE — Progress Notes (Signed)
 PHYSICAL THERAPY - DAILY TREATMENT NOTE    Patient Name: Samantha Serrano        Date: 11/26/2021  DOB: [birth year]   yes Patient DOB Verified  Visit #:   3   of   6  Insurance: Payor: AARP MEDICARE COMPLETE / Plan: [facility] AARP MEDICARE COMPLETE / Product Type: Managed Care Medicare /      In time: 9:45 am Out time: 10:35   Total Treatment Time: 50     Medicare/BCBS Time Tracking (below)   Total Timed Codes (min):  40 1:1 Treatment Time:  40     TREATMENT AREA =  Left knee pain [M25.562]    SUBJECTIVE  Pain Level (on 0 to 10 scale):  2  / 10   Medication Changes/New allergies or changes in medical history, any new surgeries or procedures?    no  If yes, update Summary List   Subjective Functional Status/Changes:  []   No changes reported     Patient reports feeling a little pain thismorning. Notes some TTP and soreness at her medial knee areas.          OBJECTIVE  Modalities Rationale:     decrease inflammation and decrease pain to improve patient's ability to bend knee, stand and walk for prolonged periods, squat.   min []  Estim, type/location:                                      []   att     []   unatt     []   w/US      []   w/ice    []   w/heat    min []   Mechanical Traction: type/lbs                   []   pro   []   sup   []   int   []   cont    []   before manual    []   after manual    min []   Ultrasound, settings/location:      min []   Iontophoresis w/ dexamethasone, location:                                               []   take home patch       []   in clinic   10 min [x]   Ice     []   Heat    location/position: Left knee; ant/post; longsitting    min []   Vasopneumatic Device, press/temp:    If using vaso (only need to measure limb vaso being performed on)      pre-treatment girth :       post-treatment girth :       measured at (landmark location) :      min []   Other:    [x]  Skin assessment post-treatment (if applicable):    [x]   intact    []   redness- no adverse reaction                  [] redness - adverse reaction:       15 min Therapeutic Exercise:  [x]   See flow sheet   Rationale:      increase ROM and increase strength to improve the patient's ability to bend  knee, stand and walk for prolonged periods, squat.     10 min Therapeutic Activity: [x]   See flow sheet   Rationale:    increase ROM, increase strength, and improve coordination to improve the patient's ability to bend knee, stand and walk for prolonged periods, squat.    15 min Neuromuscular Re-ed: [x]   See flow sheet   Rationale:    increase strength, improve coordination, and increase proprioception to improve the patient's ability to bend knee, stand and walk for prolonged periods, squat.    Billed With/As:   [x]  TE   []  TA   []  Neuro   []  Self Care Patient Education: [x]  Review HEP    []  Progressed/Changed HEP based on:   []  positioning   []  body mechanics   []  transfers   []  heat/ice application    []  other:      Other Objective/Functional Measures:    Verbal or Tactile cues required: Vcs for standing therex for LE sequencing for step ups and down  Posture/positioning: some forwards trunk lean, and valgus noted with standing therex  ROM: 2-112 degrees    Therex and NMR as per flow sheet.   Post Treatment Pain Level (on 0 to 10) scale:   0  / 10     ASSESSMENT  Assessment/Changes in Function:     Patient tolerated today's treatment well. Progressed/added therex as follows: SLS, step up/down on 4 step, and HR/Trs, and Nustep. Discussed HEP with patient. Patient is agreeable. Continue to progress as tolerated.     []   See Progress Note/Recertification   Patient will continue to benefit from skilled PT services to modify and progress therapeutic interventions, address functional mobility deficits, address ROM deficits, address strength deficits, analyze and address soft tissue restrictions, analyze and cue movement patterns, analyze and modify body mechanics/ergonomics, assess and modify postural abnormalities, address imbalance/dizziness, and instruct in home and  community integration to attain remaining goals.   Progress toward goals / Updated goals:    Patient making good progress towards all goals at this time. ROM and strength improving well. Balance is poor. All goals remain applicable.     PLAN  [x]   Upgrade activities as tolerated yes Continue plan of care   []   Discharge due to :    []   Other:      Therapist: Benedict Going, PT    Date: 11/26/2021 Time: 7:40 AM     Future Appointments   Date Time Provider Department Center   11/26/2021  9:45 AM Going Benedict, PT [facility]   11/30/2021  9:45 AM Going Benedict, PT [facility]   12/03/2021  9:45 AM Going Benedict, PT [facility]

## 2021-11-30 ENCOUNTER — Inpatient Hospital Stay: Admit: 2021-11-30 | Payer: MEDICARE | Primary: Student in an Organized Health Care Education/Training Program

## 2021-11-30 DIAGNOSIS — M25562 Pain in left knee: Secondary | ICD-10-CM | POA: Diagnosis not present

## 2021-11-30 NOTE — Progress Notes (Signed)
 PHYSICAL THERAPY - DAILY TREATMENT NOTE    Patient Name: Samantha Serrano        Date: 11/30/2021  DOB: 17-May-1941   yes Patient DOB Verified  Visit #:   4   of   6  Insurance: Payor: AARP MEDICARE COMPLETE / Plan: [facility] AARP MEDICARE COMPLETE / Product Type: Managed Care Medicare /      In time: 9:45 am Out time: 10:35 am   Total Treatment Time: 50     Medicare/BCBS Time Tracking (below)   Total Timed Codes (min):  40 1:1 Treatment Time:  40     TREATMENT AREA =  Left knee pain [M25.562]    SUBJECTIVE  Pain Level (on 0 to 10 scale):  1-2  / 10   Medication Changes/New allergies or changes in medical history, any new surgeries or procedures?    no  If yes, update Summary List   Subjective Functional Status/Changes:  []   No changes reported     Patient reports feeling pretty good today. Notes bending her knee is when she feels the most pain.          OBJECTIVE  Modalities Rationale:     decrease inflammation and decrease pain to improve patient's ability to bend and straighten knee; stand and walk for longer periods.   min []  Estim, type/location:                                      []   att     []   unatt     []   w/US      []   w/ice    []   w/heat    min []   Mechanical Traction: type/lbs                   []   pro   []   sup   []   int   []   cont    []   before manual    []   after manual    min []   Ultrasound, settings/location:      min []   Iontophoresis w/ dexamethasone, location:                                               []   take home patch       []   in clinic   10 min [x]   Ice     []   Heat    location/position: Left knee; ant/post; longsitting    min []   Vasopneumatic Device, press/temp:    If using vaso (only need to measure limb vaso being performed on)      pre-treatment girth :       post-treatment girth :       measured at (landmark location) :      min []   Other:    [x]  Skin assessment post-treatment (if applicable):    [x]   intact    []   redness- no adverse reaction                  [] redness - adverse  reaction:      15 min Therapeutic Exercise:  [x]   See flow sheet   Rationale:      increase ROM and increase strength to improve the patient's ability  to bend and straighten knee; stand and walk for longer periods.     10 min Manual Therapy: PROM and patellar mobs   Rationale:      decrease pain, increase ROM, increase tissue extensibility, decrease trigger points, and increase postural awareness to improve patient's ability to bend and straighten knee; stand and walk for longer periods.  The manual therapy interventions were performed at a separate and distinct time from the therapeutic activities interventions.    15 min Neuromuscular Re-ed: [x]   See flow sheet   Rationale:    increase strength, improve coordination, and increase proprioception to improve the patient's ability to bend and straighten knee; stand and walk for longer periods.    Billed With/As:   [x]  TE   []  TA   []  Neuro   []  Self Care Patient Education: [x]  Review HEP    []  Progressed/Changed HEP based on:   []  positioning   []  body mechanics   []  transfers   []  heat/ice application    []  other:      Other Objective/Functional Measures:    Verbal or Tactile cues required: TC required for straightening of knee with SAQ and SLRs.   Posture/positioning: good and upright; amb with FWW more controlled  ROM: 3-113 deg; extension lag increasing due to patient not working on straightening and propping at home. Discussed HEP for extension.  Palpation: hamstrings and post capsule tight    Therex and NMR as per flow sheet.   Post Treatment Pain Level (on 0 to 10) scale:   2  / 10     ASSESSMENT  Assessment/Changes in Function:     Patient tolerated today's treatment well. Progressed/added therex as follows: increased reps to 20. Discussed HEP with patient. Patient is agreeable. Continue to progress as tolerated.     []   See Progress Note/Recertification   Patient will continue to benefit from skilled PT services to modify and progress therapeutic  interventions, address functional mobility deficits, address ROM deficits, address strength deficits, analyze and address soft tissue restrictions, analyze and cue movement patterns, analyze and modify body mechanics/ergonomics, assess and modify postural abnormalities, address imbalance/dizziness, and instruct in home and community integration to attain remaining goals.   Progress toward goals / Updated goals:    Patient making good progress towards all goals at this time. Anticipated discharge this week as [place] and will return to her PT clinic closer to her home. All goals remain applicable.     PLAN  [x]   Upgrade activities as tolerated yes Continue plan of care   []   Discharge due to :    []   Other:      Therapist: Benedict Going, PT    Date: 11/30/2021 Time: 6:52 AM     Future Appointments   Date Time Provider Department Center   11/30/2021  9:45 AM Going Benedict, PT [facility]   12/03/2021  9:45 AM Going Benedict, PT [facility]

## 2021-11-30 NOTE — Progress Notes (Signed)
Necessary anesthesia labs °

## 2021-12-03 ENCOUNTER — Inpatient Hospital Stay: Admit: 2021-12-03 | Payer: MEDICARE | Primary: Student in an Organized Health Care Education/Training Program

## 2021-12-03 DIAGNOSIS — M25562 Pain in left knee: Secondary | ICD-10-CM | POA: Diagnosis not present

## 2021-12-03 NOTE — Discharge Instructions (Signed)
Therapy Discharge by Cheryll Dessert, PT at 12/03/21 0945                Author: Cheryll Dessert, PT  Service: --  Author Type: Physical Therapist       Filed: 12/07/21 0722  Date of Service: 12/03/21 0945  Status: Signed          Editor: Cheryll Dessert, PT (Physical Therapist)               [address], [address],[place] [address] - Phone: [phone]  Fax: [phone]     DISCHARGE SUMMARY FOR PHYSICAL THERAPY                  Patient Name:  Samantha Serrano  DOB:  01/02/1941        Treatment/Medical Diagnosis:  Left knee pain [M25.562]         Onset Date:  DOS 11/09/2021            Referral Source:  Melrose Nakayama, MD  Start of Care [facility]):  11/22/2021          Prior Hospitalization:  See Medical History  Provider #:  [id number]        Prior Level of Function:  Ind and pain free with ADLs and activities of leisure        Comorbidities:  Osteoporosis. Pre-diabetic. Arthritis. HTN.     Medications:  Verified on Patient Summary List          Visits from [facility]:  5  Missed Visits:  0               Goal/Measure of Progress STGs  Goal Met?        1.   Patient will reduce max pain to 5/10 in order to perform iADLs and ADLs more comfortably.           Status at last Eval:  Goal Established  Current Status:  1-2/10  yes        2.   Patient will improve Quad and Ham strength to  4/5 for prolonged periods of sitting and standing.           Status at last Eval:  Goal Established  Current Status:  4+/5  yes        3.   Patient will improve L knee ROM to 0-115 degrees  for improved functional mobility.           Status at last Eval:  Goal Established  Current Status:  2-113 deg  Progressing              Goal/Measure of Progress LTGs  Goal Met?        1.   Patient will reduce max pain to 2/10 in order to perform walking  for >15-30 minutes.           Status at last Eval:  Goal Established  Current Status:  1-2/10  yes        2.   Patient will improve  Quad and Ham strength to  5/5 in order to return to church events.           Status at last Eval:  Goal Established  Current Status:  4+/5  yes        3.   Patient will improve L knee ROM to 0-125 degrees  for improved functional mobility.  Status at last Eval:  Goal Established  Current Status:  2-113 deg  Progressing        Patient will improve FOTO score to 60 for improved functional mobility and to return to PLOF. ---> Patient improved from 46/100 to 48/100.      Key Functional Changes/Progress: No difficulty with her usual housework activities, little difficulty with light activities around her home,  Overall change reported on GROC as +6.      Assessments/Recommendations: Other: Discharge and transfer to location that is local to patient's home in [place]. Continue to  work on ROM and general strengthening. Patient also should work on hip and glute strengthening and focus on balance training to reduce fall risk.      If you have any questions/comments please contact us directly at [phone].    Thank you for allowing [place] to assist in the care of your patient.           Therapist Signature:  Cheryll Dessert, PT  Date:  12/03/2021          Reporting Period:  11/22/2021 to 12/03/2021  Time:  7:12 AM

## 2021-12-03 NOTE — Progress Notes (Signed)
 PHYSICAL THERAPY - DAILY TREATMENT NOTE    Patient Name: Samantha Serrano        Date: 12/03/2021  DOB: 07-16-1941   yes Patient DOB Verified  Visit #:   5   of   6  Insurance: Payor: AARP MEDICARE COMPLETE / Plan: [facility] AARP MEDICARE COMPLETE / Product Type: Managed Care Medicare /      In time: 9:45 am Out time: 10:35 am   Total Treatment Time: 50     Medicare/BCBS Time Tracking (below)   Total Timed Codes (min):  40 1:1 Treatment Time:  40     TREATMENT AREA =  Left knee pain [M25.562]    SUBJECTIVE  Pain Level (on 0 to 10 scale):  0  / 10   Medication Changes/New allergies or changes in medical history, any new surgeries or procedures?    no  If yes, update Summary List   Subjective Functional Status/Changes:  []   No changes reported     Patient reports today will be her last day doing PT here in [place] . She will be returning to her home in [place] on Monday and will resume PT there.          OBJECTIVE  Modalities Rationale:     decrease inflammation and decrease pain to improve patient's ability to straighten and bend knee, squat, and walk.   min []  Estim, type/location:                                      []   att     []   unatt     []   w/US      []   w/ice    []   w/heat    min []   Mechanical Traction: type/lbs                   []   pro   []   sup   []   int   []   cont    []   before manual    []   after manual    min []   Ultrasound, settings/location:      min []   Iontophoresis w/ dexamethasone, location:                                               []   take home patch       []   in clinic   10 min [x]   Ice     []   Heat    location/position: L knee; ant/post with prop    min []   Vasopneumatic Device, press/temp:    If using vaso (only need to measure limb vaso being performed on)      pre-treatment girth :       post-treatment girth :       measured at (landmark location) :      min []   Other:    [x]  Skin assessment post-treatment (if applicable):    [x]   intact    []   redness- no adverse reaction                   [] redness - adverse reaction:      15 min Therapeutic Exercise:  [x]   See flow sheet   Rationale:  increase ROM and increase strength to improve the patient's ability to straighten and bend knee, squat, and walk.     10 min Therapeutic Activity: [x]   See flow sheet   Rationale:    increase ROM, increase strength, and improve coordination to improve the patient's ability to straighten and bend knee, squat, and walk.    15 min Neuromuscular Re-ed: [x]   See flow sheet   Rationale:    increase strength, improve coordination, and increase proprioception to improve the patient's ability to straighten and bend knee, squat, and walk.    Billed With/As:   [x]  TE   []  TA   []  Neuro   []  Self Care Patient Education: [x]  Review HEP    []  Progressed/Changed HEP based on:   []  positioning   []  body mechanics   []  transfers   []  heat/ice application    []  other:      Other Objective/Functional Measures:    See d/c note    Therex and NMR as per flow sheet.   Post Treatment Pain Level (on 0 to 10) scale:   0  / 10     ASSESSMENT  Assessment/Changes in Function:     Patient tolerated today's treatment well. Progressed/added therex as follows: no progressions as this was patient's last visit. Discussed transfer to different PT clinic local to patient's home with patient. Patient is agreeable. Continue to progress as tolerated at new location.     []   See Discharge Note   Patient will continue to benefit from skilled PT services to modify and progress therapeutic interventions, address functional mobility deficits, address ROM deficits, address strength deficits, analyze and address soft tissue restrictions, analyze and cue movement patterns, analyze and modify body mechanics/ergonomics, assess and modify postural abnormalities, address imbalance/dizziness, and instruct in home and community integration to attain remaining goals.   Progress toward goals / Updated goals:    Patient making great progress towards all goals at this  time. Extension lag still present, flexion coming along progressively. Will continue to require work on strength and balance as well. All goals remain applicable.     PLAN  []   Upgrade activities as tolerated No Continue plan of care   [x]   Discharge due to : Transfer to PT clinic closer to home   []   Other:      Therapist: Jana Datsko, PT    Date: 12/03/2021 Time: 7:01 AM     Future Appointments   Date Time Provider Department Center   12/03/2021  9:45 AM Livingston Pointer, PT [facility]

## 2021-12-08 DIAGNOSIS — M25662 Stiffness of left knee, not elsewhere classified: Secondary | ICD-10-CM | POA: Diagnosis not present

## 2021-12-08 DIAGNOSIS — Z96652 Presence of left artificial knee joint: Secondary | ICD-10-CM | POA: Diagnosis not present

## 2021-12-14 DIAGNOSIS — Z96652 Presence of left artificial knee joint: Secondary | ICD-10-CM | POA: Diagnosis not present

## 2021-12-14 DIAGNOSIS — M25662 Stiffness of left knee, not elsewhere classified: Secondary | ICD-10-CM | POA: Diagnosis not present

## 2021-12-16 DIAGNOSIS — Z96652 Presence of left artificial knee joint: Secondary | ICD-10-CM | POA: Diagnosis not present

## 2021-12-16 DIAGNOSIS — M25662 Stiffness of left knee, not elsewhere classified: Secondary | ICD-10-CM | POA: Diagnosis not present

## 2021-12-21 DIAGNOSIS — Z96652 Presence of left artificial knee joint: Secondary | ICD-10-CM | POA: Diagnosis not present

## 2021-12-21 DIAGNOSIS — M25662 Stiffness of left knee, not elsewhere classified: Secondary | ICD-10-CM | POA: Diagnosis not present

## 2021-12-23 DIAGNOSIS — M25662 Stiffness of left knee, not elsewhere classified: Secondary | ICD-10-CM | POA: Diagnosis not present

## 2021-12-23 DIAGNOSIS — Z96652 Presence of left artificial knee joint: Secondary | ICD-10-CM | POA: Diagnosis not present

## 2021-12-27 DIAGNOSIS — M25662 Stiffness of left knee, not elsewhere classified: Secondary | ICD-10-CM | POA: Diagnosis not present

## 2021-12-27 DIAGNOSIS — Z96652 Presence of left artificial knee joint: Secondary | ICD-10-CM | POA: Diagnosis not present

## 2021-12-30 DIAGNOSIS — Z96652 Presence of left artificial knee joint: Secondary | ICD-10-CM | POA: Diagnosis not present

## 2021-12-30 DIAGNOSIS — M25662 Stiffness of left knee, not elsewhere classified: Secondary | ICD-10-CM | POA: Diagnosis not present

## 2022-01-03 DIAGNOSIS — M25662 Stiffness of left knee, not elsewhere classified: Secondary | ICD-10-CM | POA: Diagnosis not present

## 2022-01-03 DIAGNOSIS — Z96652 Presence of left artificial knee joint: Secondary | ICD-10-CM | POA: Diagnosis not present

## 2022-01-06 DIAGNOSIS — I10 Essential (primary) hypertension: Secondary | ICD-10-CM | POA: Diagnosis not present

## 2022-01-06 DIAGNOSIS — M25662 Stiffness of left knee, not elsewhere classified: Secondary | ICD-10-CM | POA: Diagnosis not present

## 2022-01-06 DIAGNOSIS — Z0000 Encounter for general adult medical examination without abnormal findings: Secondary | ICD-10-CM | POA: Diagnosis not present

## 2022-01-06 DIAGNOSIS — K2270 Barrett's esophagus without dysplasia: Secondary | ICD-10-CM | POA: Diagnosis not present

## 2022-01-06 DIAGNOSIS — E559 Vitamin D deficiency, unspecified: Secondary | ICD-10-CM | POA: Diagnosis not present

## 2022-01-06 DIAGNOSIS — R7309 Other abnormal glucose: Secondary | ICD-10-CM | POA: Diagnosis not present

## 2022-01-06 DIAGNOSIS — Z96652 Presence of left artificial knee joint: Secondary | ICD-10-CM | POA: Diagnosis not present

## 2022-01-06 DIAGNOSIS — L900 Lichen sclerosus et atrophicus: Secondary | ICD-10-CM | POA: Diagnosis not present

## 2022-01-06 DIAGNOSIS — M810 Age-related osteoporosis without current pathological fracture: Secondary | ICD-10-CM | POA: Diagnosis not present

## 2022-01-06 DIAGNOSIS — E538 Deficiency of other specified B group vitamins: Secondary | ICD-10-CM | POA: Diagnosis not present

## 2022-01-11 DIAGNOSIS — M25662 Stiffness of left knee, not elsewhere classified: Secondary | ICD-10-CM | POA: Diagnosis not present

## 2022-01-11 DIAGNOSIS — Z96652 Presence of left artificial knee joint: Secondary | ICD-10-CM | POA: Diagnosis not present

## 2022-01-13 DIAGNOSIS — Z96652 Presence of left artificial knee joint: Secondary | ICD-10-CM | POA: Diagnosis not present

## 2022-01-13 DIAGNOSIS — M25662 Stiffness of left knee, not elsewhere classified: Secondary | ICD-10-CM | POA: Diagnosis not present

## 2022-01-17 DIAGNOSIS — Z96652 Presence of left artificial knee joint: Secondary | ICD-10-CM | POA: Diagnosis not present

## 2022-01-17 DIAGNOSIS — M25662 Stiffness of left knee, not elsewhere classified: Secondary | ICD-10-CM | POA: Diagnosis not present

## 2022-01-20 DIAGNOSIS — Z96652 Presence of left artificial knee joint: Secondary | ICD-10-CM | POA: Diagnosis not present

## 2022-01-20 DIAGNOSIS — M25662 Stiffness of left knee, not elsewhere classified: Secondary | ICD-10-CM | POA: Diagnosis not present

## 2022-01-25 DIAGNOSIS — Z96652 Presence of left artificial knee joint: Secondary | ICD-10-CM | POA: Diagnosis not present

## 2022-01-25 DIAGNOSIS — M25662 Stiffness of left knee, not elsewhere classified: Secondary | ICD-10-CM | POA: Diagnosis not present

## 2022-01-27 DIAGNOSIS — M25662 Stiffness of left knee, not elsewhere classified: Secondary | ICD-10-CM | POA: Diagnosis not present

## 2022-01-27 DIAGNOSIS — Z96652 Presence of left artificial knee joint: Secondary | ICD-10-CM | POA: Diagnosis not present

## 2022-01-31 DIAGNOSIS — M25662 Stiffness of left knee, not elsewhere classified: Secondary | ICD-10-CM | POA: Diagnosis not present

## 2022-01-31 DIAGNOSIS — Z96652 Presence of left artificial knee joint: Secondary | ICD-10-CM | POA: Diagnosis not present

## 2022-02-03 DIAGNOSIS — M25662 Stiffness of left knee, not elsewhere classified: Secondary | ICD-10-CM | POA: Diagnosis not present

## 2022-02-03 DIAGNOSIS — Z96652 Presence of left artificial knee joint: Secondary | ICD-10-CM | POA: Diagnosis not present

## 2022-02-07 DIAGNOSIS — M25562 Pain in left knee: Secondary | ICD-10-CM | POA: Diagnosis not present

## 2022-02-07 DIAGNOSIS — M25662 Stiffness of left knee, not elsewhere classified: Secondary | ICD-10-CM | POA: Diagnosis not present

## 2022-02-07 DIAGNOSIS — Z9889 Other specified postprocedural states: Secondary | ICD-10-CM | POA: Diagnosis not present

## 2022-02-07 DIAGNOSIS — Z96652 Presence of left artificial knee joint: Secondary | ICD-10-CM | POA: Diagnosis not present

## 2022-02-10 DIAGNOSIS — M25662 Stiffness of left knee, not elsewhere classified: Secondary | ICD-10-CM | POA: Diagnosis not present

## 2022-02-10 DIAGNOSIS — Z96652 Presence of left artificial knee joint: Secondary | ICD-10-CM | POA: Diagnosis not present

## 2022-02-14 DIAGNOSIS — M25662 Stiffness of left knee, not elsewhere classified: Secondary | ICD-10-CM | POA: Diagnosis not present

## 2022-02-14 DIAGNOSIS — Z96652 Presence of left artificial knee joint: Secondary | ICD-10-CM | POA: Diagnosis not present

## 2022-02-21 DIAGNOSIS — Z96652 Presence of left artificial knee joint: Secondary | ICD-10-CM | POA: Diagnosis not present

## 2022-02-21 DIAGNOSIS — M25662 Stiffness of left knee, not elsewhere classified: Secondary | ICD-10-CM | POA: Diagnosis not present

## 2022-02-24 DIAGNOSIS — Z96652 Presence of left artificial knee joint: Secondary | ICD-10-CM | POA: Diagnosis not present

## 2022-02-24 DIAGNOSIS — M25662 Stiffness of left knee, not elsewhere classified: Secondary | ICD-10-CM | POA: Diagnosis not present

## 2022-02-28 DIAGNOSIS — M25662 Stiffness of left knee, not elsewhere classified: Secondary | ICD-10-CM | POA: Diagnosis not present

## 2022-02-28 DIAGNOSIS — Z96652 Presence of left artificial knee joint: Secondary | ICD-10-CM | POA: Diagnosis not present

## 2022-03-04 DIAGNOSIS — Z96652 Presence of left artificial knee joint: Secondary | ICD-10-CM | POA: Diagnosis not present

## 2022-03-04 DIAGNOSIS — M25662 Stiffness of left knee, not elsewhere classified: Secondary | ICD-10-CM | POA: Diagnosis not present

## 2022-03-08 DIAGNOSIS — Z96652 Presence of left artificial knee joint: Secondary | ICD-10-CM | POA: Diagnosis not present

## 2022-03-08 DIAGNOSIS — M25662 Stiffness of left knee, not elsewhere classified: Secondary | ICD-10-CM | POA: Diagnosis not present

## 2022-03-09 DIAGNOSIS — Z1231 Encounter for screening mammogram for malignant neoplasm of breast: Secondary | ICD-10-CM | POA: Diagnosis not present

## 2022-03-10 DIAGNOSIS — L900 Lichen sclerosus et atrophicus: Secondary | ICD-10-CM | POA: Diagnosis not present

## 2022-03-11 DIAGNOSIS — Z96652 Presence of left artificial knee joint: Secondary | ICD-10-CM | POA: Diagnosis not present

## 2022-03-11 DIAGNOSIS — M25662 Stiffness of left knee, not elsewhere classified: Secondary | ICD-10-CM | POA: Diagnosis not present

## 2022-03-24 DIAGNOSIS — G5601 Carpal tunnel syndrome, right upper limb: Secondary | ICD-10-CM | POA: Diagnosis not present

## 2022-03-24 DIAGNOSIS — G5602 Carpal tunnel syndrome, left upper limb: Secondary | ICD-10-CM | POA: Diagnosis not present

## 2022-03-28 DIAGNOSIS — Z96652 Presence of left artificial knee joint: Secondary | ICD-10-CM | POA: Diagnosis not present

## 2022-03-28 DIAGNOSIS — M25662 Stiffness of left knee, not elsewhere classified: Secondary | ICD-10-CM | POA: Diagnosis not present

## 2022-03-30 DIAGNOSIS — M8588 Other specified disorders of bone density and structure, other site: Secondary | ICD-10-CM | POA: Diagnosis not present

## 2022-03-30 DIAGNOSIS — Z780 Asymptomatic menopausal state: Secondary | ICD-10-CM | POA: Diagnosis not present

## 2022-03-30 DIAGNOSIS — M810 Age-related osteoporosis without current pathological fracture: Secondary | ICD-10-CM | POA: Diagnosis not present

## 2022-04-04 DIAGNOSIS — M25662 Stiffness of left knee, not elsewhere classified: Secondary | ICD-10-CM | POA: Diagnosis not present

## 2022-04-04 DIAGNOSIS — Z96652 Presence of left artificial knee joint: Secondary | ICD-10-CM | POA: Diagnosis not present

## 2022-04-11 DIAGNOSIS — Z96652 Presence of left artificial knee joint: Secondary | ICD-10-CM | POA: Diagnosis not present

## 2022-04-11 DIAGNOSIS — M25662 Stiffness of left knee, not elsewhere classified: Secondary | ICD-10-CM | POA: Diagnosis not present

## 2022-04-15 DIAGNOSIS — L900 Lichen sclerosus et atrophicus: Secondary | ICD-10-CM | POA: Diagnosis not present

## 2022-05-25 DIAGNOSIS — Z96652 Presence of left artificial knee joint: Secondary | ICD-10-CM | POA: Diagnosis not present

## 2022-05-25 DIAGNOSIS — M25552 Pain in left hip: Secondary | ICD-10-CM | POA: Diagnosis not present

## 2022-06-03 DIAGNOSIS — S76212D Strain of adductor muscle, fascia and tendon of left thigh, subsequent encounter: Secondary | ICD-10-CM | POA: Diagnosis not present

## 2022-07-05 DIAGNOSIS — I10 Essential (primary) hypertension: Secondary | ICD-10-CM | POA: Diagnosis not present

## 2022-07-05 DIAGNOSIS — R7309 Other abnormal glucose: Secondary | ICD-10-CM | POA: Diagnosis not present

## 2022-07-05 DIAGNOSIS — M810 Age-related osteoporosis without current pathological fracture: Secondary | ICD-10-CM | POA: Diagnosis not present

## 2022-07-11 DIAGNOSIS — M810 Age-related osteoporosis without current pathological fracture: Secondary | ICD-10-CM | POA: Diagnosis not present

## 2022-10-12 DIAGNOSIS — L900 Lichen sclerosus et atrophicus: Secondary | ICD-10-CM | POA: Diagnosis not present

## 2023-01-24 DIAGNOSIS — I10 Essential (primary) hypertension: Secondary | ICD-10-CM | POA: Diagnosis not present

## 2023-01-24 DIAGNOSIS — L900 Lichen sclerosus et atrophicus: Secondary | ICD-10-CM | POA: Diagnosis not present

## 2023-01-24 DIAGNOSIS — R7309 Other abnormal glucose: Secondary | ICD-10-CM | POA: Diagnosis not present

## 2023-01-24 DIAGNOSIS — K2270 Barrett's esophagus without dysplasia: Secondary | ICD-10-CM | POA: Diagnosis not present

## 2023-01-24 DIAGNOSIS — R202 Paresthesia of skin: Secondary | ICD-10-CM | POA: Diagnosis not present

## 2023-01-24 DIAGNOSIS — Z23 Encounter for immunization: Secondary | ICD-10-CM | POA: Diagnosis not present

## 2023-01-24 DIAGNOSIS — M810 Age-related osteoporosis without current pathological fracture: Secondary | ICD-10-CM | POA: Diagnosis not present

## 2023-01-24 DIAGNOSIS — Z0000 Encounter for general adult medical examination without abnormal findings: Secondary | ICD-10-CM | POA: Diagnosis not present

## 2023-01-24 DIAGNOSIS — E538 Deficiency of other specified B group vitamins: Secondary | ICD-10-CM | POA: Diagnosis not present

## 2023-01-24 DIAGNOSIS — G629 Polyneuropathy, unspecified: Secondary | ICD-10-CM | POA: Diagnosis not present

## 2023-01-24 DIAGNOSIS — E559 Vitamin D deficiency, unspecified: Secondary | ICD-10-CM | POA: Diagnosis not present

## 2023-01-24 DIAGNOSIS — Z79899 Other long term (current) drug therapy: Secondary | ICD-10-CM | POA: Diagnosis not present

## 2023-02-24 NOTE — Progress Notes (Signed)
Follow-up Visit   Date: 02/27/23   [name] MRN: [id number] DOB: September 14, 1941   Interim History: Marie Carpenter is a 82 y.o. right-handed Caucasian female with hypertension, peptic ulcer disease, and neuropathy returning to the clinic for follow-up of neuropathy and bilateral carpal tunnel syndrome.  The patient was accompanied to the clinic by self.  IMPRESSION/PLAN: Bilateral hand numbness, worsening. 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  To be sure there is no worsening neuropathy in the hands or overlapping cervical radiculopathy, she will return for NCS/EMG of the arms to look for interval change.   Further recommendations pending results.  --------------------------------------------------------------------- History of present illness: She has numbness/tingling of the hands for the past several years.  It is worse in the thumb, index finger, and middle finger which is worse on the right.  She was recommended to use wrist brace by her PCP due to concern of carpal tunnel syndrome, and tried it for about a month.  She has difficulty with holding a pen, utensils, and opening jars/bottles.  She does not have neck pain or radicular arm pain.   She was seen in 2018 here for neuropathy in the feet.  This remains in the toes and continues to have numbness. She completed PT which helped her balance.  She walks unassisted. She has not suffered any falls. 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      UPDATE 06/24/2021:  She is here for  follow-up visit.  Her EMG in April showed severe bilateral CTS and she tried wearing wrist braces, but it did not help.  Her balance is poor because of ongoing left knee pain and she is compliant with using her rigid walker at all times.  Neuropathy in the feet is stable without progression.  She is scheduled to have an MRI of the left knee next week.  She does not wish to proceed with any further evaluation with her CTS, until after her knee issues are resolved.   UPDATE 02/24/2023:   She underwent right CTS release in October 2022, but has not appreciated any improvement.  She feels that numbness is worse in the hand and having greater difficult with fine motor tasks.  She did not have the left hand released and given no improvement, is leaning against surgery.  She denies neck pain or shooting pain in the arms.  She has neuropathy in the lower legs and feet, which has been stable.  She uses a cane to walk.       Medications:  Current Outpatient Medications on File Prior to Visit  Medication Sig Dispense Refill   acetaminophen (TYLENOL) 650 MG CR tablet Take 1,300 mg by mouth every 8 (eight) hours as needed for pain.     Cholecalciferol (VITAMIN D) 50 MCG (2000 UT) tablet Take 4,000 Units by mouth daily.     clobetasol ointment (TEMOVATE) [id number] % Apply 1 application topically as needed (rash).  0   lisinopril (ZESTRIL) 20 MG tablet Take 20 mg by mouth daily.  0   pantoprazole (PROTONIX) 40 MG tablet  Take 1 tablet (40 mg total) by mouth 2 (two) times daily before a meal. Switch for any other PPI at similar dose and frequency (Patient taking differently: Take 40 mg by mouth daily. Switch for any other PPI at similar dose and frequency) 30 tablet 3   No current facility-administered medications on file prior to visit.    Allergies:  Allergies  Allergen Reactions   Hctz [Hydrochlorothiazide] Other (See Comments)    hyponatremia   Penicillins Rash and Other (See Comments)    Has patient had a PCN  reaction causing immediate rash, facial/tongue/throat swelling, SOB or lightheadedness with hypotension: no Has patient had a PCN reaction causing severe rash involving mucus membranes or skin necrosis: no Has patient had a PCN reaction that required hospitalization no Has patient had a PCN reaction occurring within the last 10 years: yes 5 years ago If all of the above answers are "NO", then may proceed with Cephalosporin use. * tolerated Ancef on 11/09/21    Vital Signs:  BP (!) 196/64 Comment: Did not take Blood Pressure Medicine  Pulse 75   Ht 5' 3.5" (1.613 m)   Wt 149 lb (67.6 kg)   SpO2 96%   BMI 25.98 kg/m     Neurological Exam: MENTAL STATUS including orientation to time, place, person, recent and remote memory, attention span and concentration, language, and fund of knowledge is normal.  Speech is not dysarthric.  CRANIAL NERVES:  Pupils equal round and reactive to light.  Normal conjugate, extra-ocular eye movements in all directions of gaze.  No ptosis.    MOTOR:  Motor strength is 5/5 in all extremities, except intrinsic hand muscles 4+/5.  No atrophy, fasciculations or abnormal movements.  No pronator drift.  Tone is normal.    MSRs:  Reflexes are 2+/4 throughout, except absent Achilles bilaterally.  SENSORY:  Reduced vibration below the ankles  COORDINATION/GAIT:  Normal finger-to- nose-finger.  Intact rapid alternating movements bilaterally.  Mildly ataxic gait, assisted with a cane.  Stable.   Data: NCS/EMG of the arms 03/24/2021: Bilateral median neuropathy at or distal to the wrist, consistent with a clinical diagnosis of carpal tunnel syndrome.  Overall, these findings are severe in degree electrically. Bilateral ulnar neuropathy with slowing across the elbow, demyelinating, mild. Incidentally, there is evidence of bilateral Martin-Gruber anastomosis, a normal anatomic variant. There is no evidence of a sensorimotor polyneuropathy affecting the upper  extremities.    Thank you for allowing me to participate in patient's care.  If I can answer any additional questions, I would be pleased to do so.    Sincerely,    Nakayla Rorabaugh K. Posey Pronto, DO

## 2023-02-27 ENCOUNTER — Ambulatory Visit: Payer: Medicare Other | Admitting: Neurology

## 2023-02-27 ENCOUNTER — Encounter: Payer: Self-pay | Admitting: Neurology

## 2023-02-27 VITALS — BP 196/64 | HR 75 | Ht 63.5 in | Wt 149.0 lb

## 2023-02-27 DIAGNOSIS — G5623 Lesion of ulnar nerve, bilateral upper limbs: Secondary | ICD-10-CM

## 2023-02-27 DIAGNOSIS — G629 Polyneuropathy, unspecified: Secondary | ICD-10-CM | POA: Diagnosis not present

## 2023-02-27 DIAGNOSIS — G5603 Carpal tunnel syndrome, bilateral upper limbs: Secondary | ICD-10-CM

## 2023-02-27 IMAGING — US US EXTREM LOW VENOUS*L*
1 series · 13 of 24 positions shown · non-contrast
Comparison: None.

CLINICAL DATA: 79-year-old female with left leg swelling



[Series 1: us extrem low venous*left* · 0.08mm/px · 13 of 34 slices shown]
[im 1/34]
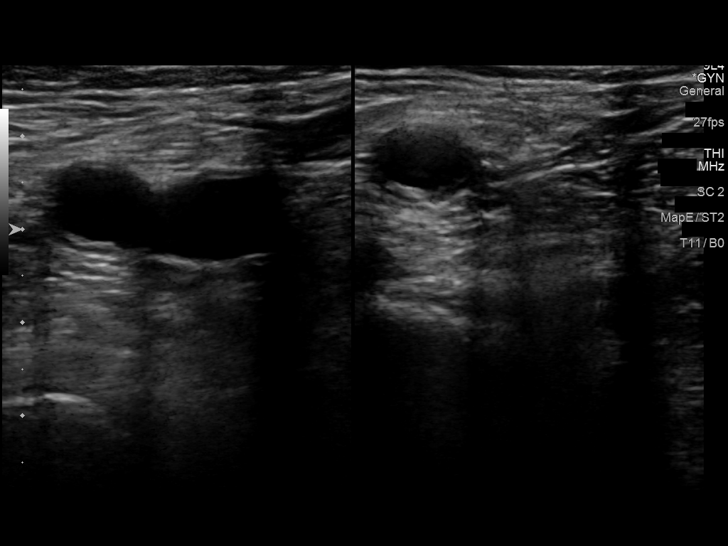
[im 3/34]
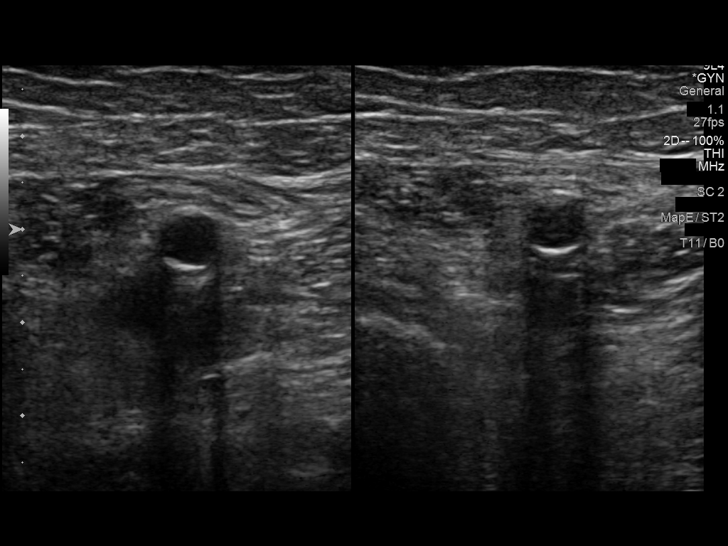
[im 6/34]
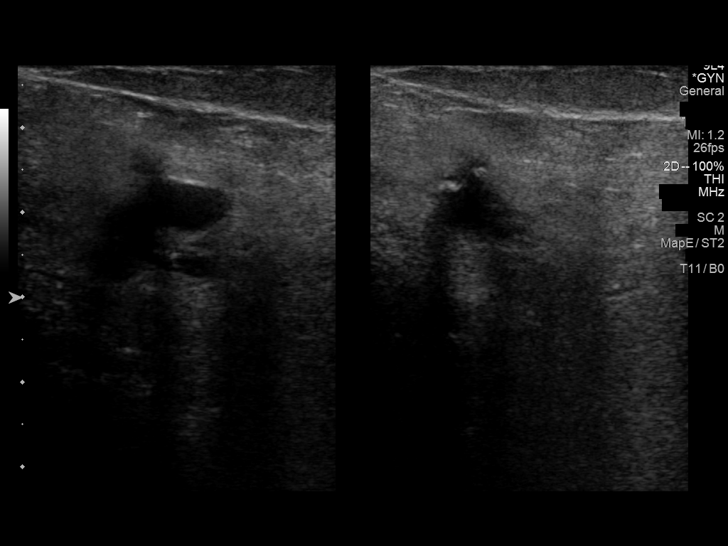
[im 9/34]
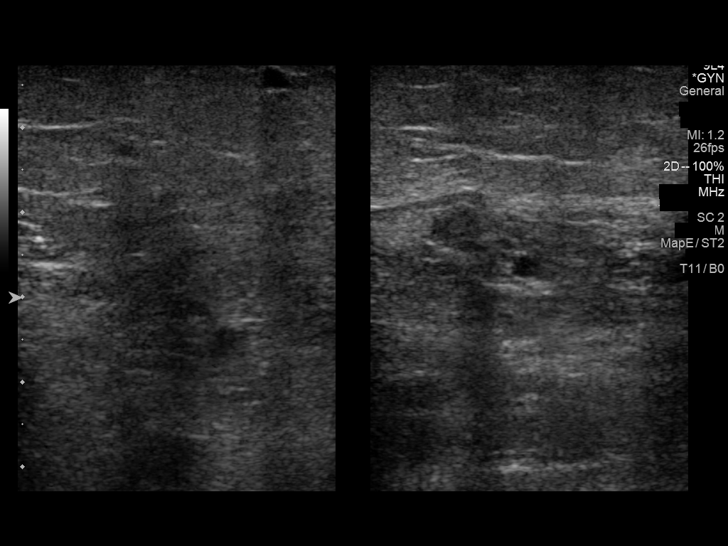
[im 12/34]
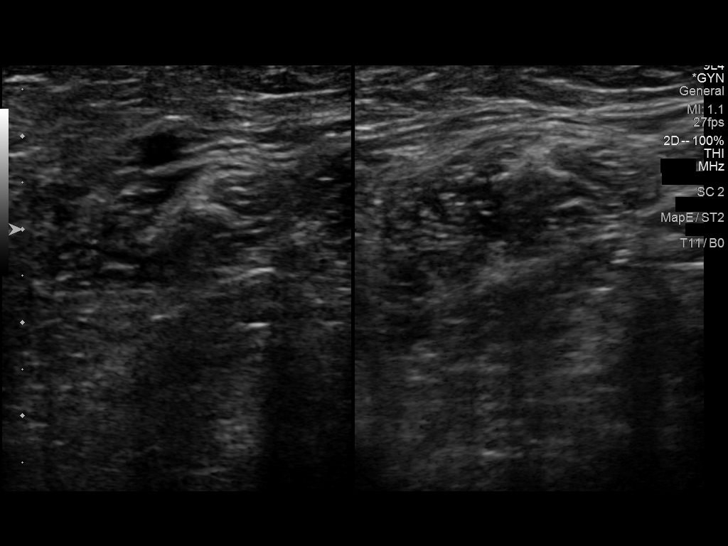
[im 15/34]
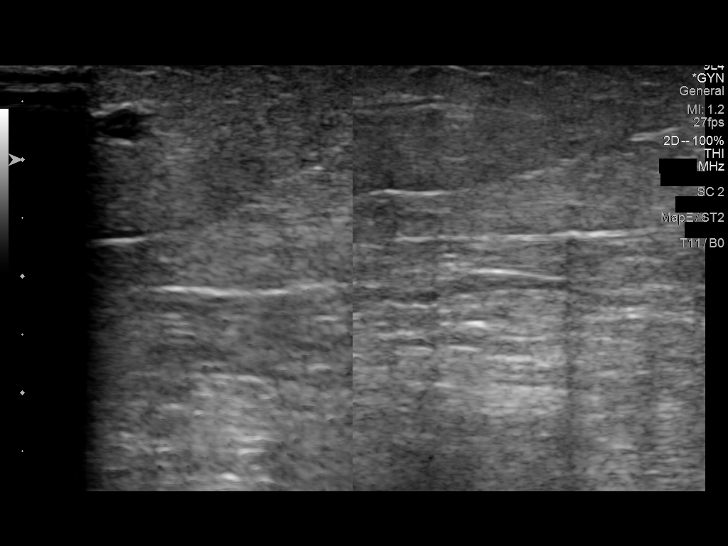
[im 18/34]
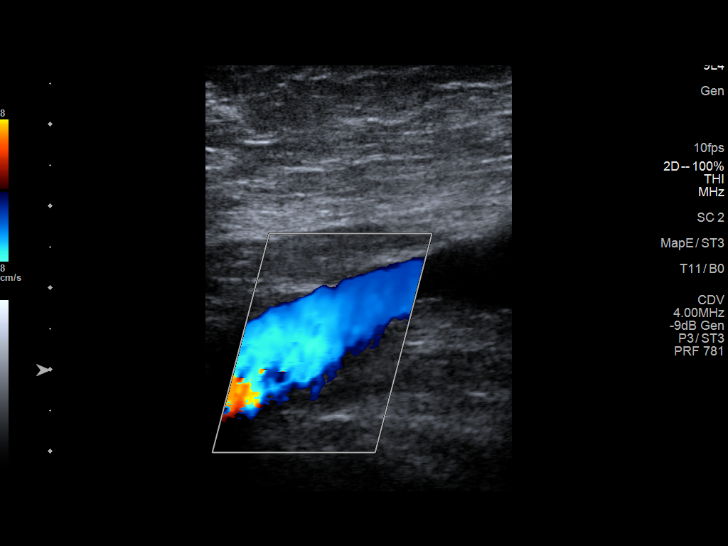
[im 19/34]
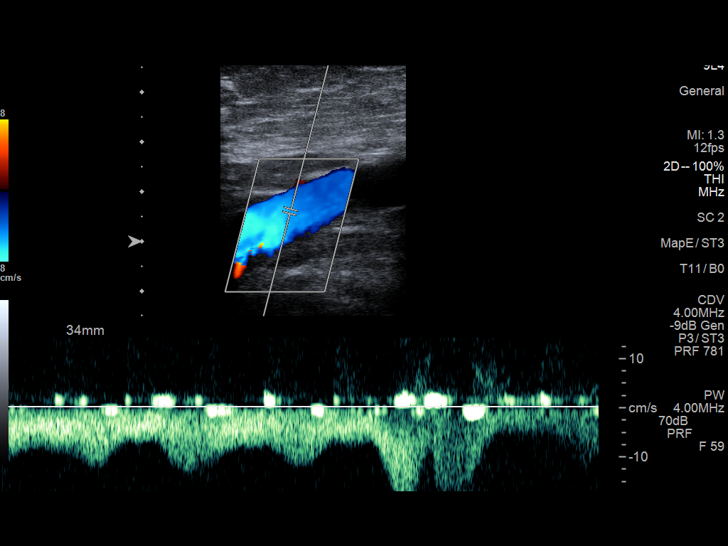
[im 22/34]
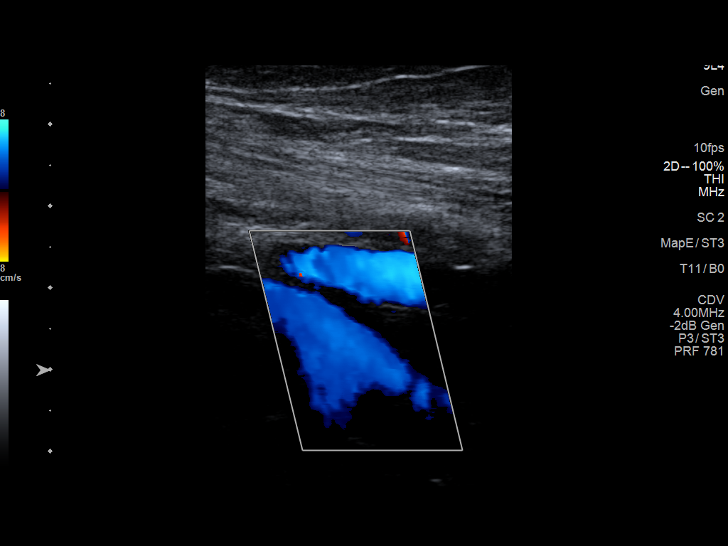
[im 25/34]
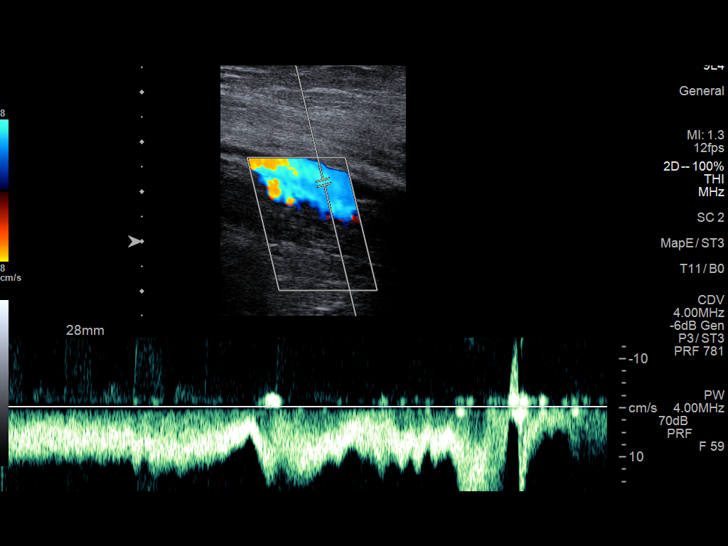
[im 28/34]
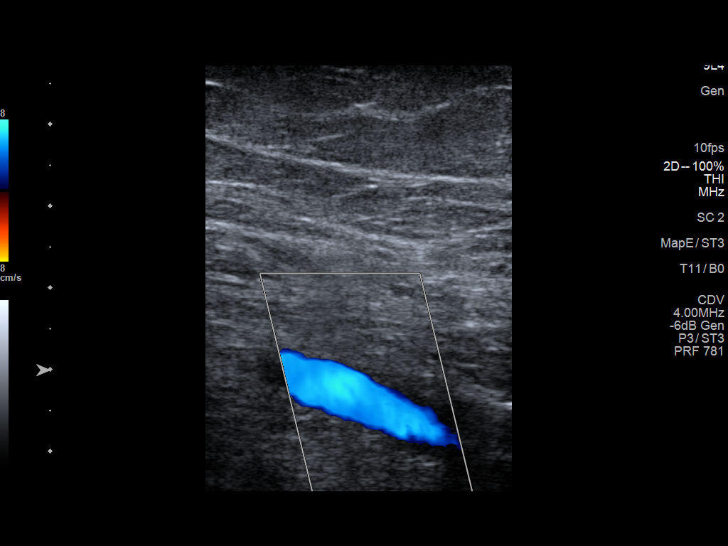
[im 31/34]
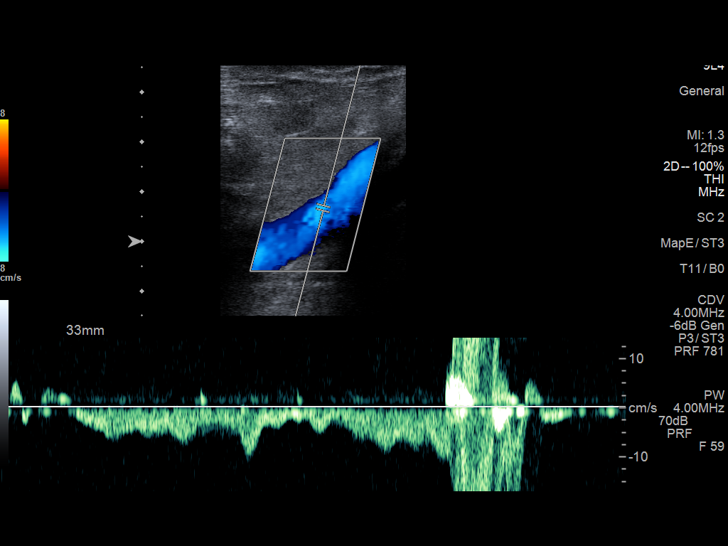
[im 34/34]
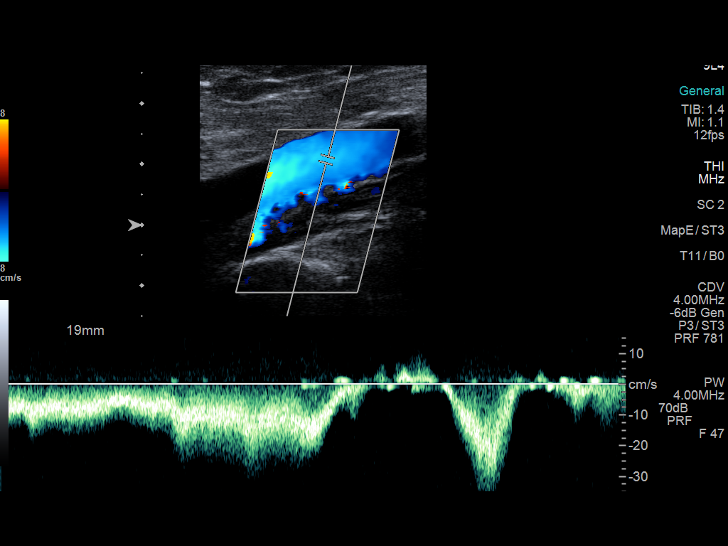

[13 of 24 positions shown; findings below may reference images not displayed]

FINDINGS: Contralateral Common Femoral Vein: Respiratory phasicity is normal
and symmetric with the symptomatic side. No evidence of thrombus.
Normal compressibility.

Common Femoral Vein: No evidence of thrombus. Normal
compressibility, respiratory phasicity and response to augmentation.

Saphenofemoral Junction: No evidence of thrombus. Normal
compressibility and flow on color Doppler imaging.

Profunda Femoral Vein: No evidence of thrombus. Normal
compressibility and flow on color Doppler imaging.

Femoral Vein: No evidence of thrombus. Normal compressibility,
respiratory phasicity and response to augmentation.

Popliteal Vein: No evidence of thrombus. Normal compressibility,
respiratory phasicity and response to augmentation.

Calf Veins: No evidence of thrombus. Normal compressibility and flow
on color Doppler imaging.

Superficial Great Saphenous Vein: No evidence of thrombus. Normal
compressibility and flow on color Doppler imaging.

Other Findings:  None.
IMPRESSION: Sonographic survey of the left lower extremity negative for DVT

## 2023-02-27 NOTE — Patient Instructions (Signed)
Nerve testing of the arms  ELECTROMYOGRAM AND NERVE CONDUCTION STUDIES (EMG/NCS) INSTRUCTIONS  How to Prepare The neurologist conducting the EMG will need to know if you have certain medical conditions. Tell the neurologist and other EMG lab personnel if you: . Have a pacemaker or any other electrical medical device . Take blood-thinning medications . Have hemophilia, a blood-clotting disorder that causes prolonged bleeding Bathing Take a shower or bath shortly before your exam in order to remove oils from your skin. Don't apply lotions or creams before the exam.  What to Expect You'll likely be asked to change into a hospital gown for the procedure and lie down on an examination table. The following explanations can help you understand what will happen during the exam.  . Electrodes. The neurologist or a technician places surface electrodes at various locations on your skin depending on where you're experiencing symptoms. Or the neurologist may insert needle electrodes at different sites depending on your symptoms.  . Sensations. The electrodes will at times transmit a tiny electrical current that you may feel as a twinge or spasm. The needle electrode may cause discomfort or pain that usually ends shortly after the needle is removed. If you are concerned about discomfort or pain, you may want to talk to the neurologist about taking a short break during the exam.  . Instructions. During the needle EMG, the neurologist will assess whether there is any spontaneous electrical activity when the muscle is at rest - activity that isn't present in healthy muscle tissue - and the degree of activity when you slightly contract the muscle.  He or she will give you instructions on resting and contracting a muscle at appropriate times. Depending on what muscles and nerves the neurologist is examining, he or she may ask you to change positions during the exam.  After your EMG You may experience some temporary,  minor bruising where the needle electrode was inserted into your muscle. This bruising should fade within several days. If it persists, contact your primary care doctor.    

## 2023-03-22 DIAGNOSIS — Z1231 Encounter for screening mammogram for malignant neoplasm of breast: Secondary | ICD-10-CM | POA: Diagnosis not present

## 2023-03-23 ENCOUNTER — Ambulatory Visit: Payer: Medicare Other | Admitting: Neurology

## 2023-03-23 DIAGNOSIS — G5623 Lesion of ulnar nerve, bilateral upper limbs: Secondary | ICD-10-CM

## 2023-03-23 DIAGNOSIS — G5603 Carpal tunnel syndrome, bilateral upper limbs: Secondary | ICD-10-CM

## 2023-03-23 DIAGNOSIS — G629 Polyneuropathy, unspecified: Secondary | ICD-10-CM

## 2023-03-23 NOTE — Procedures (Signed)
[facility]  [address] [place], [address]  [place], [place] [address] Tel: [phone] Fax: [phone] Test Date:  03/23/2023  Patient: Marie Carpenter DOB: Nov 20, 1941 Physician: Nita Sickle, DO  Sex: Female Height: 5\' 3"  Ref Phys: Nita Sickle, DO  ID#: [id number]   Technician:    History: This is a 82 year old female with history of right CTS release and ulnar nerve decompression referred for evaluation of bilateral hand paresthesias.  NCV & EMG Findings: Extensive electrodiagnostic testing of the right upper extremity and additional studies of the left shows:  Bilateral median sensory responses are absent.  Bilateral ulnar sensory responses show prolonged latency (R4.6, L4.5 ms). Bilateral median motor responses show prolonged latency (R4.5, L4.3 ms) and reduced amplitude (R2.6, L2.3 mV).  Right ulnar motor responses within normal limits, and improved compared to prior study on 03/24/2021.  Left ulnar motor response shows slowed conduction velocity across the elbow (A Elbow-B Elbow, 34 m/s).   Chronic motor axon loss changes are seen affecting bilateral abductor pollicis brevis muscles, without   Impression: Bilateral median neuropathy at or distal to the wrist, consistent with a clinical diagnosis of carpal tunnel syndrome.  Overall, these findings are severe in degree electrically and stable compared to study on 03/24/2021. Left ulnar neuropathy with slowing across the elbow, purely demyelinating, moderate. Previously seen right ulnar neuropathy across the elbow is no longer present.   ___________________________ Nita Sickle, DO    Nerve Conduction Studies   Stim Site NR Peak (ms) Norm Peak (ms) O-P Amp (V) Norm O-P Amp  Left Median Anti Sensory (2nd Digit)  33 C  Wrist *NR  <3.8  >10  Right Median Anti Sensory (2nd Digit)  33 C  Wrist *NR  <3.8  >10  Left Ulnar Anti Sensory (5th Digit)  33 C  Wrist    *4.5 <3.2 11.2 >5  Right Ulnar Anti Sensory (5th  Digit)  33 C  Wrist    *4.6 <3.2 10.0 >5     Stim Site NR Onset (ms) Norm Onset (ms) O-P Amp (mV) Norm O-P Amp Site1 Site2 Delta-0 (ms) Dist (cm) Vel (m/s) Norm Vel (m/s)  Left Median Motor (Abd Poll Brev)  33 C  Wrist    *4.3 <4.0 *2.3 >5 Elbow Wrist 4.5 28.0 62 >50  Elbow    8.8  1.6         Right Median Motor (Abd Poll Brev)  33 C  Wrist    *4.5 <4.0 *2.6 >5 Elbow Wrist 5.6 28.0 50 >50  Elbow    10.1  2.4         Left Ulnar Motor (Abd Dig Minimi)  33 C  Wrist    2.9 <3.1 7.1 >7 B Elbow Wrist 3.2 21.0 66 >50  B Elbow    6.1  6.5  A Elbow B Elbow 2.9 10.0 *34 >50  A Elbow    9.0  6.0         Right Ulnar Motor (Abd Dig Minimi)  33 C  Wrist    2.3 <3.1 10.0 >7 B Elbow Wrist 4.2 22.0 52 >50  B Elbow    6.5  8.9  A Elbow B Elbow 1.5 10.0 67 >50  A Elbow    8.0  8.7          Electromyography   Side Muscle Ins.Act Fibs Fasc Recrt Amp Dur Poly Activation Comment  Right 1stDorInt Nml Nml Nml Nml Nml Nml Nml Nml N/A  Right Abd  Poll Brev Nml Nml Nml *2- *1+ *1+ *1+ Nml N/A  Right PronatorTeres Nml Nml Nml Nml Nml Nml Nml Nml N/A  Right Biceps Nml Nml Nml Nml Nml Nml Nml Nml N/A  Right Triceps Nml Nml Nml Nml Nml Nml Nml Nml N/A  Right Deltoid Nml Nml Nml Nml Nml Nml Nml Nml N/A  Left 1stDorInt Nml Nml Nml Nml Nml Nml Nml Nml N/A  Left Abd Poll Brev Nml Nml Nml *SMU *1+ *1+ *1+ Nml N/A  Left PronatorTeres Nml Nml Nml Nml Nml Nml Nml Nml N/A  Left Biceps Nml Nml Nml Nml Nml Nml Nml Nml N/A  Left Triceps Nml Nml Nml Nml Nml Nml Nml Nml N/A  Left Deltoid Nml Nml Nml Nml Nml Nml Nml Nml N/A  Left FlexCarpiUln Nml Nml Nml Nml Nml Nml Nml Nml N/A      Waveforms:

## 2023-04-13 ENCOUNTER — Encounter: Payer: Medicare Other | Admitting: Neurology

## 2023-04-21 DIAGNOSIS — Z8719 Personal history of other diseases of the digestive system: Secondary | ICD-10-CM | POA: Diagnosis not present

## 2023-04-21 DIAGNOSIS — K5730 Diverticulosis of large intestine without perforation or abscess without bleeding: Secondary | ICD-10-CM | POA: Diagnosis not present

## 2023-05-01 DIAGNOSIS — L900 Lichen sclerosus et atrophicus: Secondary | ICD-10-CM | POA: Diagnosis not present

## 2023-06-10 IMAGING — DX DG CHEST 2V
2 series · 2 of 2 positions shown · non-contrast
Comparison: December 26, 2016

CLINICAL DATA: Preoperative evaluation prior to left knee
replacement.

EXAM:
CHEST - 2 VIEW

[chest pa]
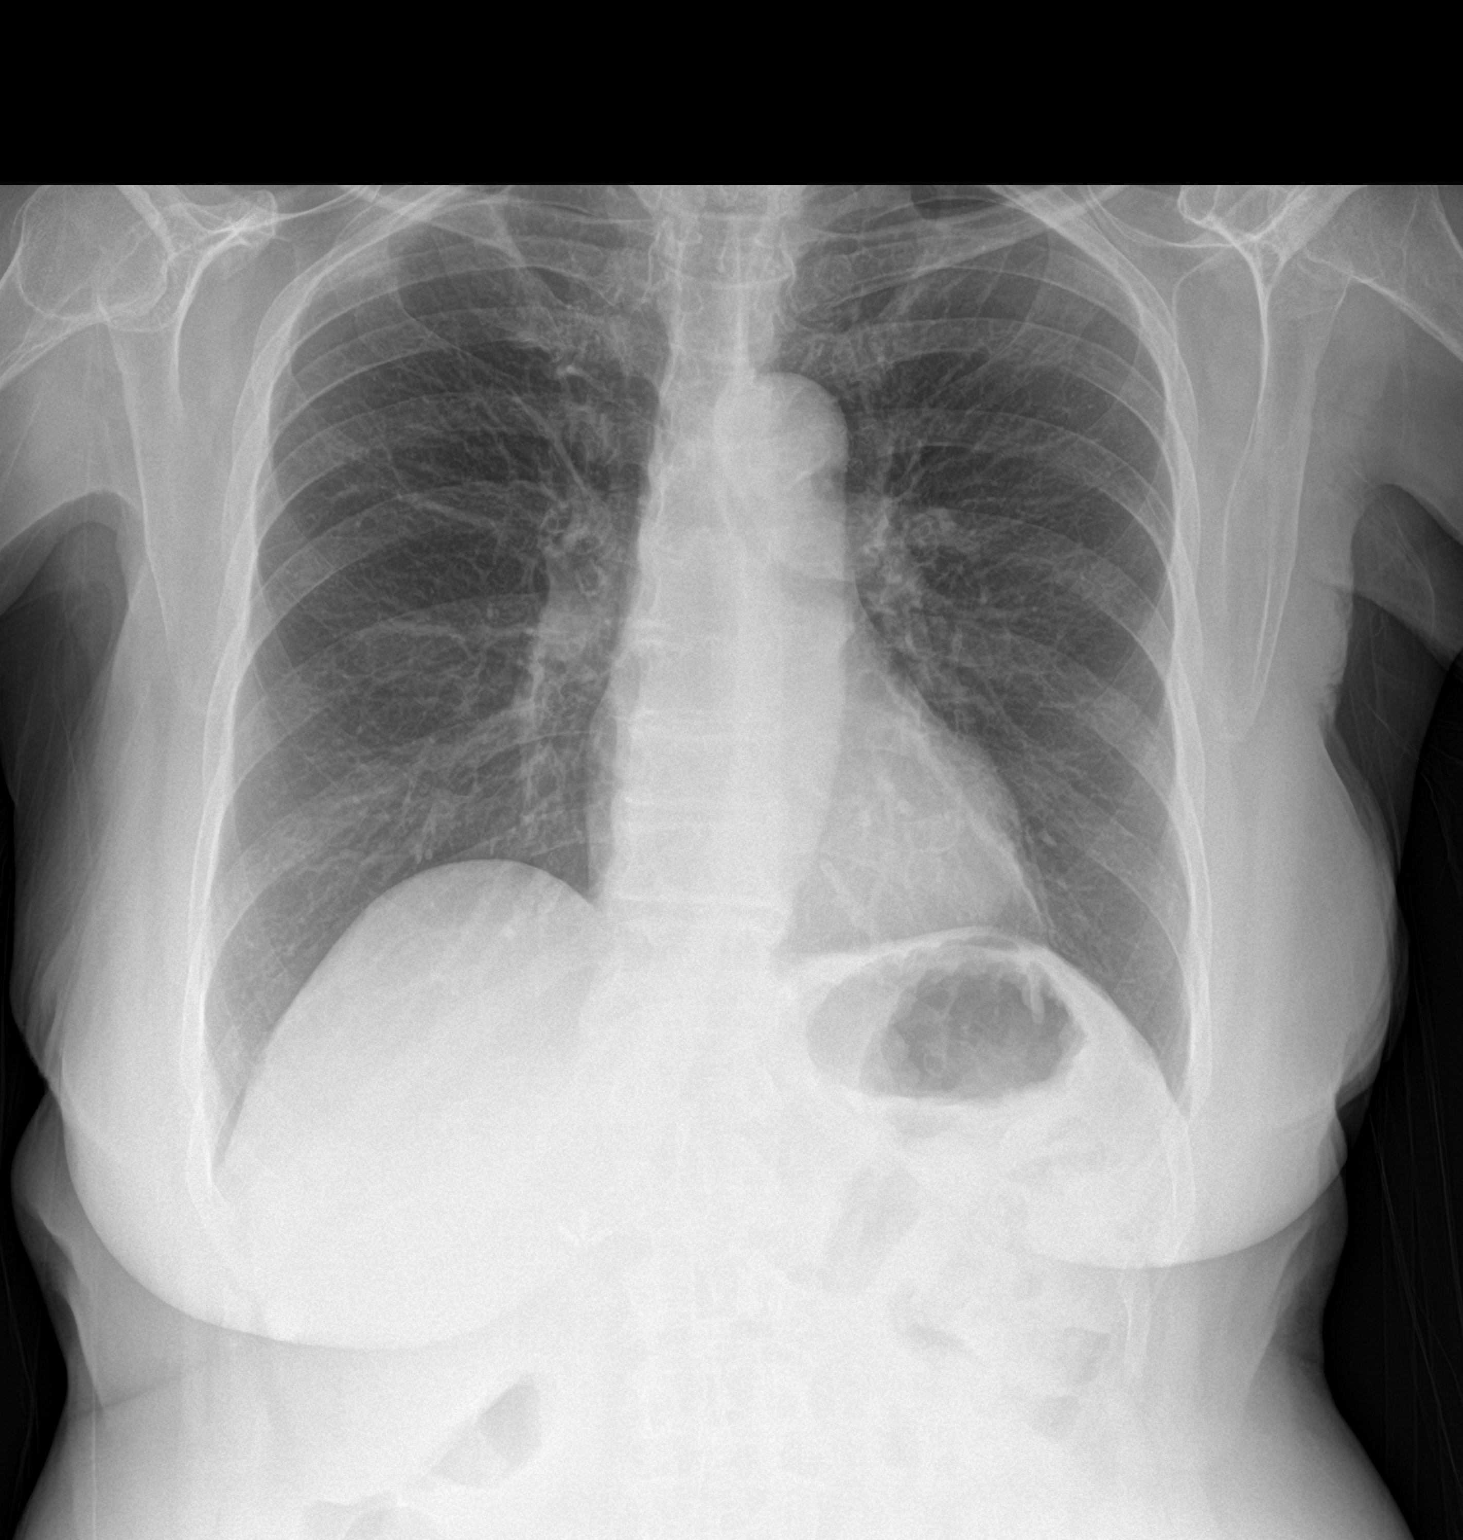

[chest lat]
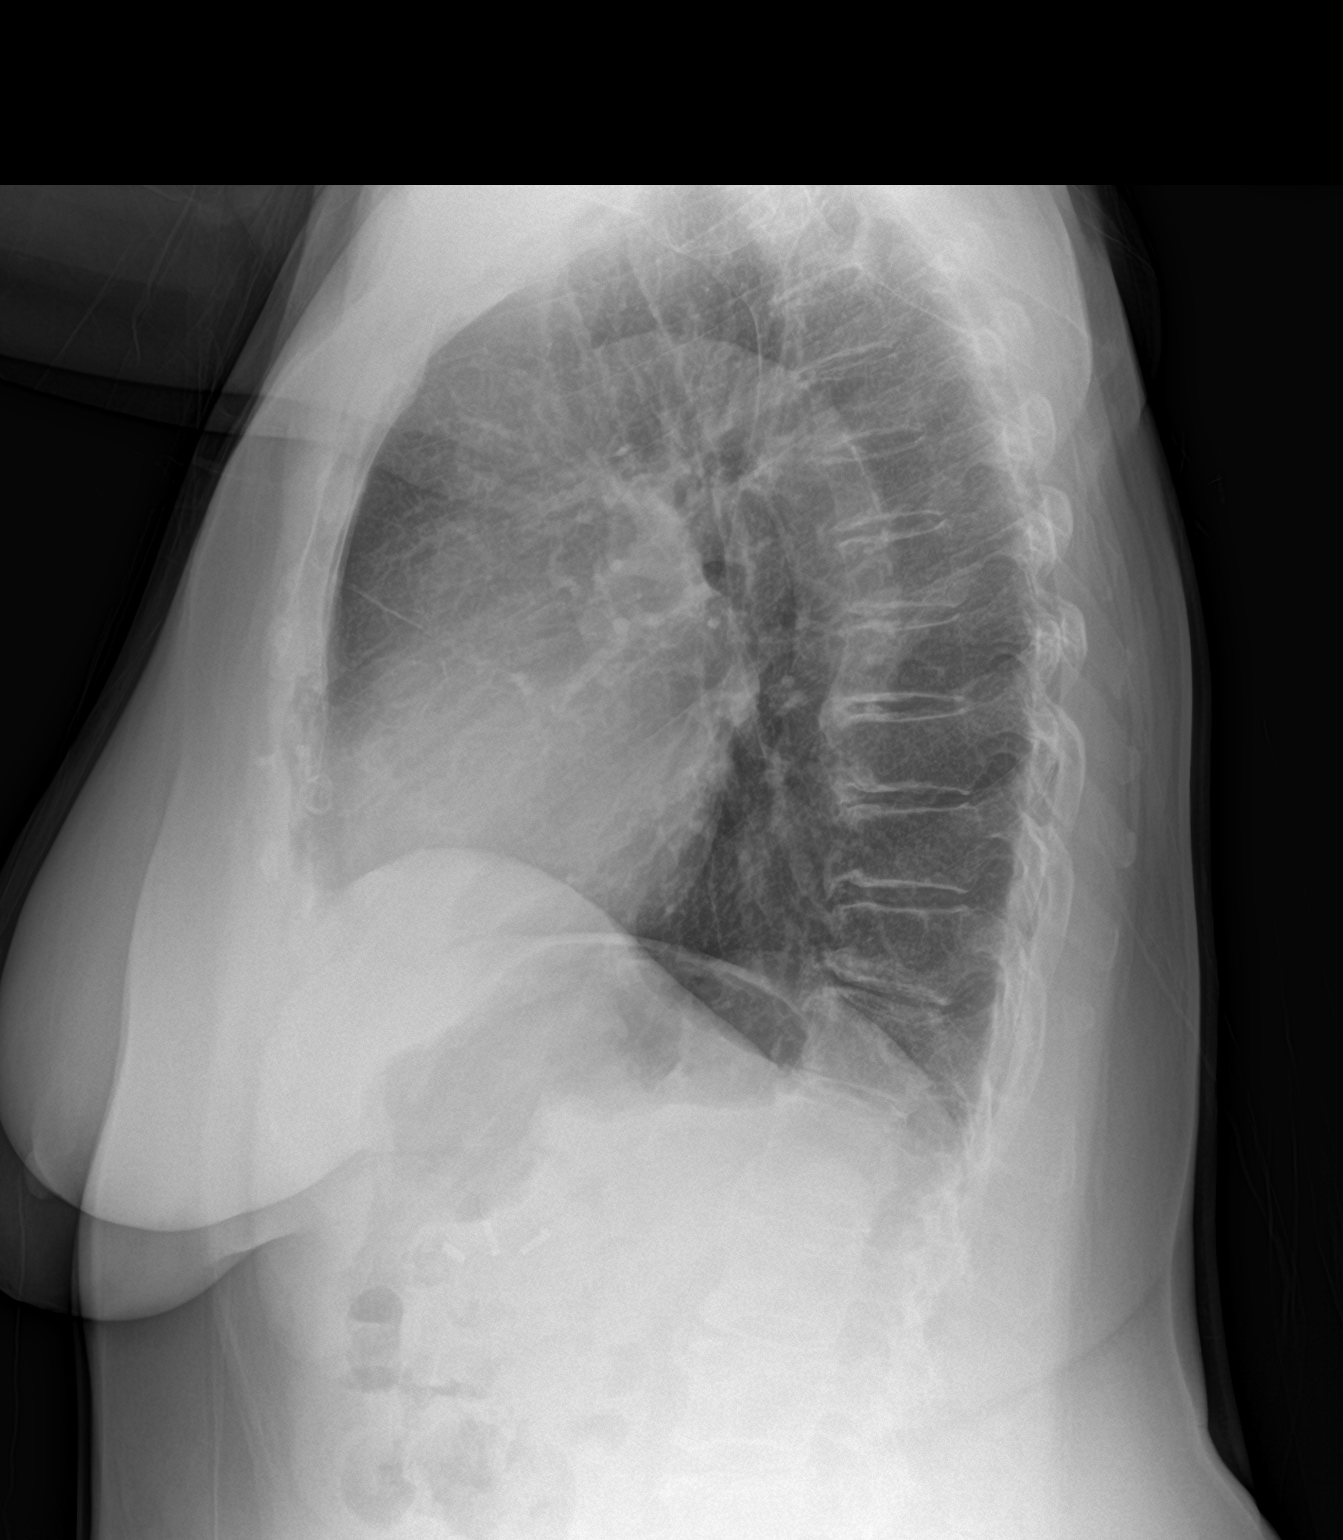

[2 of 2 positions shown; findings below may reference images not displayed]

FINDINGS: The heart size and mediastinal contours are within normal limits.
Both lungs are clear. Radiopaque surgical clips are seen within the
right upper quadrant. A chronic deformity is seen along the distal
aspect of the right clavicle. The visualized skeletal structures are
unremarkable.
IMPRESSION: No active cardiopulmonary disease.

## 2023-06-29 DIAGNOSIS — I10 Essential (primary) hypertension: Secondary | ICD-10-CM | POA: Diagnosis not present

## 2023-06-29 DIAGNOSIS — M810 Age-related osteoporosis without current pathological fracture: Secondary | ICD-10-CM | POA: Diagnosis not present

## 2023-06-29 DIAGNOSIS — G629 Polyneuropathy, unspecified: Secondary | ICD-10-CM | POA: Diagnosis not present

## 2023-06-29 DIAGNOSIS — Z9989 Dependence on other enabling machines and devices: Secondary | ICD-10-CM | POA: Diagnosis not present

## 2023-06-29 DIAGNOSIS — R252 Cramp and spasm: Secondary | ICD-10-CM | POA: Diagnosis not present

## 2023-07-26 DIAGNOSIS — M810 Age-related osteoporosis without current pathological fracture: Secondary | ICD-10-CM | POA: Diagnosis not present

## 2023-08-31 DIAGNOSIS — G5603 Carpal tunnel syndrome, bilateral upper limbs: Secondary | ICD-10-CM | POA: Diagnosis not present

## 2023-08-31 DIAGNOSIS — M65332 Trigger finger, left middle finger: Secondary | ICD-10-CM | POA: Diagnosis not present

## 2023-09-28 DIAGNOSIS — M65332 Trigger finger, left middle finger: Secondary | ICD-10-CM | POA: Diagnosis not present

## 2023-09-28 DIAGNOSIS — G5602 Carpal tunnel syndrome, left upper limb: Secondary | ICD-10-CM | POA: Diagnosis not present

## 2023-09-28 DIAGNOSIS — G5601 Carpal tunnel syndrome, right upper limb: Secondary | ICD-10-CM | POA: Diagnosis not present

## 2023-10-04 DIAGNOSIS — G5602 Carpal tunnel syndrome, left upper limb: Secondary | ICD-10-CM | POA: Diagnosis not present

## 2023-11-02 DIAGNOSIS — L900 Lichen sclerosus et atrophicus: Secondary | ICD-10-CM | POA: Diagnosis not present

## 2023-11-22 DIAGNOSIS — E119 Type 2 diabetes mellitus without complications: Secondary | ICD-10-CM | POA: Diagnosis not present

## 2024-01-29 DIAGNOSIS — Z23 Encounter for immunization: Secondary | ICD-10-CM | POA: Diagnosis not present

## 2024-01-29 DIAGNOSIS — K2270 Barrett's esophagus without dysplasia: Secondary | ICD-10-CM | POA: Diagnosis not present

## 2024-01-29 DIAGNOSIS — L900 Lichen sclerosus et atrophicus: Secondary | ICD-10-CM | POA: Diagnosis not present

## 2024-01-29 DIAGNOSIS — Z0000 Encounter for general adult medical examination without abnormal findings: Secondary | ICD-10-CM | POA: Diagnosis not present

## 2024-01-29 DIAGNOSIS — Z79899 Other long term (current) drug therapy: Secondary | ICD-10-CM | POA: Diagnosis not present

## 2024-01-29 DIAGNOSIS — E538 Deficiency of other specified B group vitamins: Secondary | ICD-10-CM | POA: Diagnosis not present

## 2024-01-29 DIAGNOSIS — R7309 Other abnormal glucose: Secondary | ICD-10-CM | POA: Diagnosis not present

## 2024-01-29 DIAGNOSIS — I10 Essential (primary) hypertension: Secondary | ICD-10-CM | POA: Diagnosis not present

## 2024-01-29 DIAGNOSIS — G629 Polyneuropathy, unspecified: Secondary | ICD-10-CM | POA: Diagnosis not present

## 2024-01-29 DIAGNOSIS — Z8711 Personal history of peptic ulcer disease: Secondary | ICD-10-CM | POA: Diagnosis not present

## 2024-01-29 DIAGNOSIS — M810 Age-related osteoporosis without current pathological fracture: Secondary | ICD-10-CM | POA: Diagnosis not present

## 2024-01-29 DIAGNOSIS — E559 Vitamin D deficiency, unspecified: Secondary | ICD-10-CM | POA: Diagnosis not present

## 2024-02-08 DIAGNOSIS — M65332 Trigger finger, left middle finger: Secondary | ICD-10-CM | POA: Diagnosis not present

## 2024-02-08 DIAGNOSIS — G5601 Carpal tunnel syndrome, right upper limb: Secondary | ICD-10-CM | POA: Diagnosis not present

## 2024-02-08 DIAGNOSIS — G5602 Carpal tunnel syndrome, left upper limb: Secondary | ICD-10-CM | POA: Diagnosis not present
